# Patient Record
Sex: Male | Born: 1970 | Marital: Married | Smoking: Former smoker
Health system: Southern US, Community
[De-identification: ages and names within clinical notes are randomized; demographics above are authoritative.]

## PROBLEM LIST (undated history)

## (undated) DIAGNOSIS — E785 Hyperlipidemia, unspecified: Secondary | ICD-10-CM

## (undated) DIAGNOSIS — I2510 Atherosclerotic heart disease of native coronary artery without angina pectoris: Secondary | ICD-10-CM

## (undated) DIAGNOSIS — I10 Essential (primary) hypertension: Secondary | ICD-10-CM

## (undated) HISTORY — PX: CARDIAC CATHETERIZATION: SHX172

---

## 2015-09-14 HISTORY — PX: VASECTOMY: SHX75

## 2016-12-14 ENCOUNTER — Other Ambulatory Visit: Payer: Self-pay | Admitting: Gastroenterology

## 2016-12-14 DIAGNOSIS — R1084 Generalized abdominal pain: Secondary | ICD-10-CM

## 2016-12-25 ENCOUNTER — Other Ambulatory Visit: Payer: BC Managed Care – PPO

## 2016-12-28 ENCOUNTER — Ambulatory Visit
Admission: RE | Admit: 2016-12-28 | Discharge: 2016-12-28 | Disposition: A | Discharge status: Home / Self Care | Payer: BC Managed Care – PPO | Source: Ambulatory Visit | Attending: Gastroenterology | Admitting: Gastroenterology

## 2016-12-28 DIAGNOSIS — R1084 Generalized abdominal pain: Secondary | ICD-10-CM

## 2017-01-07 ENCOUNTER — Encounter: Payer: Self-pay | Admitting: Hematology and Oncology

## 2017-01-15 ENCOUNTER — Telehealth: Payer: Self-pay | Admitting: Hematology and Oncology

## 2017-01-15 ENCOUNTER — Ambulatory Visit (HOSPITAL_BASED_OUTPATIENT_CLINIC_OR_DEPARTMENT_OTHER): Payer: BC Managed Care – PPO | Admitting: Hematology and Oncology

## 2017-01-15 ENCOUNTER — Encounter: Payer: Self-pay | Admitting: Hematology and Oncology

## 2017-01-15 ENCOUNTER — Ambulatory Visit (HOSPITAL_BASED_OUTPATIENT_CLINIC_OR_DEPARTMENT_OTHER): Payer: BC Managed Care – PPO

## 2017-01-15 ENCOUNTER — Telehealth: Payer: Self-pay

## 2017-01-15 VITALS — BP 129/74 | HR 68 | Temp 98.5°F | Resp 20 | Ht 73.5 in | Wt 219.4 lb

## 2017-01-15 DIAGNOSIS — R5383 Other fatigue: Secondary | ICD-10-CM

## 2017-01-15 DIAGNOSIS — R740 Nonspecific elevation of levels of transaminase and lactic acid dehydrogenase [LDH]: Secondary | ICD-10-CM

## 2017-01-15 DIAGNOSIS — D709 Neutropenia, unspecified: Secondary | ICD-10-CM

## 2017-01-15 DIAGNOSIS — D696 Thrombocytopenia, unspecified: Secondary | ICD-10-CM | POA: Diagnosis not present

## 2017-01-15 DIAGNOSIS — D7589 Other specified diseases of blood and blood-forming organs: Secondary | ICD-10-CM

## 2017-01-15 DIAGNOSIS — R7401 Elevation of levels of liver transaminase levels: Secondary | ICD-10-CM | POA: Insufficient documentation

## 2017-01-15 LAB — COMPREHENSIVE METABOLIC PANEL WITH GFR
ALT: 116 U/L — ABNORMAL HIGH (ref 0–55)
AST: 57 U/L — ABNORMAL HIGH (ref 5–34)
Albumin: 4.9 g/dL (ref 3.5–5.0)
Alkaline Phosphatase: 68 U/L (ref 40–150)
Anion Gap: 10 meq/L (ref 3–11)
BUN: 10.1 mg/dL (ref 7.0–26.0)
CO2: 30 meq/L — ABNORMAL HIGH (ref 22–29)
Calcium: 9.7 mg/dL (ref 8.4–10.4)
Chloride: 102 meq/L (ref 98–109)
Creatinine: 1 mg/dL (ref 0.7–1.3)
EGFR: 87 ml/min/1.73 m2 — ABNORMAL LOW
Glucose: 87 mg/dL (ref 70–140)
Potassium: 3.8 meq/L (ref 3.5–5.1)
Sodium: 142 meq/L (ref 136–145)
Total Bilirubin: 0.97 mg/dL (ref 0.20–1.20)
Total Protein: 7.9 g/dL (ref 6.4–8.3)

## 2017-01-15 LAB — MORPHOLOGY: PLT EST: DECREASED

## 2017-01-15 LAB — LACTATE DEHYDROGENASE: LDH: 186 U/L (ref 125–245)

## 2017-01-15 NOTE — Progress Notes (Signed)
[facility] New Visit:  Assessment: Thrombocytopenia ([place]) Overall healthy 46 year old male referred to the clinic or abnormalities were discovered on lab work obtained 14 yearly visit. This consisted of a mild thrombocytopenia with no previous lab work available for comparison, leukopenia with predominant lymphopenia and normal hemoglobin with macrocytosis and hyperchromia in the setting of elevated inflammatory markers judged by the level of ferritin. Concurrently, hepatic abnormalities were discovered with mild elevation of bilirubin, AST and ALT. She was referred to [place] for evaluation of hematological abnormalities.  Repeat labwork was obtained last week and demonstrated improving white blood cell count, stable hemoglobin and stable platelets. He remains elevated and possibly trending up.  Judging by the clinical history, the differential diagnosis likely includes a viral infection with EBV or CMV as patient is negative for hepatitis viruses. Additionally, hemachromatosis should be considered in the setting of the elevation of the ferritin. Both infectious mononucleosis and iron overload would count for the hematological abnormalities and radiographically abnormal appearance of the liver.  Today, we will focus on the viral serologies and we'll bring the patient back in 1 week to discuss the findings with additional labs obtained at that time to pursue the possible hemachromatosis diagnosis.  Plan: --Labs today --RTC 1 week for review    Voice recognition software was used and creation of this note. Despite my best effort at editing the text, some misspelling/errors may have occurred.  Orders Placed This Encounter  Procedures  . Comprehensive metabolic panel    Standing Status:   Future    Number of Occurrences:   1    Standing Expiration Date:   01/15/2018  . Lactate dehydrogenase (LDH)    Standing Status:   Future    Number of Occurrences:   1    Standing Expiration  Date:   01/15/2018  . Morphology    Standing Status:   Future    Number of Occurrences:   1    Standing Expiration Date:   01/15/2018  . TSH    Standing Status:   Future    Number of Occurrences:   1    Standing Expiration Date:   01/15/2018  . Folate, Serum    Standing Status:   Future    Number of Occurrences:   1    Standing Expiration Date:   01/15/2018  . Vitamin B12    Standing Status:   Future    Number of Occurrences:   1    Standing Expiration Date:   01/15/2018  . ANA, IFA (with reflex)    Standing Status:   Future    Number of Occurrences:   1    Standing Expiration Date:   01/15/2018  . Epstein-Barr virus VCA, IgG    Standing Status:   Future    Number of Occurrences:   1    Standing Expiration Date:   01/15/2018  . Epstein-Barr virus VCA, IgM    Standing Status:   Future    Number of Occurrences:   1    Standing Expiration Date:   01/15/2018  . Epstein-Barr virus early D antigen antibody, IgG    Standing Status:   Future    Number of Occurrences:   1    Standing Expiration Date:   01/15/2018  . Epstein-Barr virus nuclear antigen antibody, IgG    Standing Status:   Future    Number of Occurrences:   1    Standing Expiration Date:   01/15/2018  .  CMV IgM    Standing Status:   Future    Number of Occurrences:   1    Standing Expiration Date:   01/15/2018  . CMV antibody, IgG (EIA)    Standing Status:   Future    Number of Occurrences:   1    Standing Expiration Date:   01/15/2018  . Rheumatoid factor    Standing Status:   Future    Number of Occurrences:   1    Standing Expiration Date:   01/15/2018    All questions were answered.  . The patient knows to call the clinic with any problems, questions or concerns.  This note was electronically signed.    History of Presenting Illness MAKYI LEDO 46 y.o. presenting to the [place] for evaluation of leukopenia and thrombocytopenia. 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 It appears that he had a 6  contact with a candidate who had infectious mononucleosis towards the end of the school year. At the end of the school year and slightly afterwards, he has been experiencing significant fatigue, but no lymphadenopathy, nausea, abdominal pain, or early satiety. Symptoms have improved gradually and by now he has returned to his baseline state of health. He is baseline state of health includes an alternating and off constipation and diarrhea. He experiences no bowel movements for approximately 3 days than has a bout of feeling unwell with headaches and dizziness and subsequently passes hard bowel movement full by liquid diarrhea. He denies having red blood or melena. No abdominal pain at the baseline. No nausea or vomiting. He denies any respiratory, cardiovascular, neurological, genitourinary, or musculoskeletal symptoms at this time.  Oncological/hematological History: --Labs, 12/03/16: WBC 3.0, Hgb 15.8, MCV 99.0, MCH 34.5, Plt 104; tProt 7.3, Alb 5.0, tBili 1.1, AP 55, AST 49, ALT 97; TSH 2.11 --Labs, 12/14/16: Ferritin 438; HCV &  HBV -- negative; --Labs, 01/08/17: WBC 4.0, ANC 2.7, ALC 0.80, Mono 0.5, Baso 0.0, Hgb 15.4, MCV 96.9, MCH 34.5, Plt 107; tBili 1.1, AP 62, AST 60, ALT 120; Ferritin 705, ANA -- positive  Medical History: History reviewed. No pertinent past medical history.  Surgical History: Past Surgical History:  Procedure Laterality Date  . VASECTOMY  09/2015    Family History: Family History  Problem Relation Age of Onset  . Colitis Mother   . Cancer Father        Jaw and lung  . Stroke Maternal Grandfather        Cause of death - young  . Heart attack Paternal Grandfather     Social History: Social History   Social History  . Marital status: Unknown    Spouse name: N/A  . Number of children: N/A  . Years of education: N/A   Occupational History  . Not on file.   Social History Main Topics  . Smoking status: Current Some Day Smoker  . Smokeless tobacco:  Former Systems developer     Comment: social  . Alcohol use 0.6 oz/week    1 Cans of beer per week  . Drug use: Yes    Types: Marijuana  . Sexual activity: Not on file   Other Topics Concern  . Not on file   Social History Narrative  . No narrative on file    Allergies: Allergies  Allergen Reactions  . Amoxicillin Swelling and Rash  . Biaxin [Clarithromycin] Swelling    Medications:  Current Outpatient Prescriptions  Medication Sig Dispense Refill  .  fexofenadine (ALLEGRA) 30 MG tablet Take 30 mg by mouth daily.     No current facility-administered medications for this visit.     Review of Systems: Review of Systems  All other systems reviewed and are negative.    PHYSICAL EXAMINATION Blood pressure 129/74, pulse 68, temperature 98.5 F (36.9 C), temperature source Oral, resp. rate 20, height 6' 1.5" (1.867 m), weight 219 lb 6.4 oz (99.5 kg), SpO2 100 %.  ECOG PERFORMANCE STATUS: 0 - Asymptomatic  Physical Exam  Constitutional: He is oriented to person, place, and time and well-developed, well-nourished, and in no distress. No distress.  HENT:  Head: Normocephalic and atraumatic.  Mouth/Throat: Oropharynx is clear and moist. No oropharyngeal exudate.  Eyes: Pupils are equal, round, and reactive to light. No scleral icterus.  Neck: Normal range of motion. No thyromegaly present.  Cardiovascular: Normal rate, regular rhythm and normal heart sounds.   No murmur heard. Pulmonary/Chest: Effort normal and breath sounds normal. No respiratory distress. He has no wheezes.  Abdominal: Soft. He exhibits no distension and no mass. There is no tenderness.  Musculoskeletal: Normal range of motion.  Lymphadenopathy:    He has no cervical adenopathy.  Neurological: He is alert and oriented to person, place, and time. He has normal reflexes. No cranial nerve deficit.  Skin: Skin is warm. No rash noted. He is not diaphoretic. No erythema. No pallor.     LABORATORY DATA: I have  personally reviewed the data as listed: Appointment on 01/15/2017  Component Date Value Ref Range Status  . Sodium 01/15/2017 142  136 - 145 mEq/L Final  . Potassium 01/15/2017 3.8  3.5 - 5.1 mEq/L Final  . Chloride 01/15/2017 102  98 - 109 mEq/L Final  . CO2 01/15/2017 30* 22 - 29 mEq/L Final  . Glucose 01/15/2017 87  70 - 140 mg/dl Final   Glucose reference range is for nonfasting patients. Fasting glucose reference range is 70- 100.  Marland Kitchen BUN 01/15/2017 10.1  7.0 - 26.0 mg/dL Final  . Creatinine 01/15/2017 1.0  0.7 - 1.3 mg/dL Final  . Total Bilirubin 01/15/2017 0.97  0.20 - 1.20 mg/dL Final  . Alkaline Phosphatase 01/15/2017 68  40 - 150 U/L Final  . AST 01/15/2017 57* 5 - 34 U/L Final  . ALT 01/15/2017 116* 0 - 55 U/L Final  . Total Protein 01/15/2017 7.9  6.4 - 8.3 g/dL Final  . Albumin 01/15/2017 4.9  3.5 - 5.0 g/dL Final  . Calcium 01/15/2017 9.7  8.4 - 10.4 mg/dL Final  . Anion Gap 01/15/2017 10  3 - 11 mEq/L Final  . EGFR 01/15/2017 87* >90 ml/min/1.73 m2 Final   eGFR is calculated using the CKD-EPI Creatinine Equation (2009)  . LDH 01/15/2017 186  125 - 245 U/L Final  . Polychromasia 01/15/2017 Slight  Slight Final  . Tear Drop Cells 01/15/2017 Oc  Negative Final  . Ovalocytes 01/15/2017 Few  Negative Final  . White Cell Comments 01/15/2017 C/W auto diff   Final  . PLT EST 01/15/2017 Decreased  Adequate Final         Ardath Sax, MD

## 2017-01-15 NOTE — Assessment & Plan Note (Signed)
Overall healthy 46 year old male referred to the clinic or abnormalities were discovered on lab work obtained 14 yearly visit. This consisted of a mild thrombocytopenia with no previous lab work available for comparison, leukopenia with predominant lymphopenia and normal hemoglobin with macrocytosis and hyperchromia in the setting of elevated inflammatory markers judged by the level of ferritin. Concurrently, hepatic abnormalities were discovered with mild elevation of bilirubin, AST and ALT. She was referred to us for evaluation of hematological abnormalities.  Repeat labwork was obtained last week and demonstrated improving white blood cell count, stable hemoglobin and stable platelets. He remains elevated and possibly trending up.  Judging by the clinical history, the differential diagnosis likely includes a viral infection with EBV or CMV as patient is negative for hepatitis viruses. Additionally, hemachromatosis should be considered in the setting of the elevation of the ferritin. Both infectious mononucleosis and iron overload would count for the hematological abnormalities and radiographically abnormal appearance of the liver.  Today, we will focus on the viral serologies and we'll bring the patient back in 1 week to discuss the findings with additional labs obtained at that time to pursue the possible hemachromatosis diagnosis.  Plan: --Labs today --RTC 1 week for review

## 2017-01-15 NOTE — Telephone Encounter (Signed)
Requested lab work to be sent to [phone]. Secretary said she would send labs from June and July: CBC, Iron, and Testosterone.  Received: TSH, CMET (12/03/16); Ferritin, Iron, Hepatitis Panel (12/14/16); CBC, CMET, ANA, Ferritin (01/08/17)

## 2017-01-15 NOTE — Telephone Encounter (Signed)
Scheduled appt per 8/3 los - Gave patient AVS and calender per los. - labs today .

## 2017-01-16 LAB — EPSTEIN-BARR VIRUS EARLY D ANTIGEN ANTIBODY, IGG

## 2017-01-16 LAB — RHEUMATOID FACTOR: RA Latex Turbid.: 170.9 IU/mL — ABNORMAL HIGH (ref 0.0–13.9)

## 2017-01-16 LAB — CMV ANTIBODY, IGG (EIA)

## 2017-01-16 LAB — EPSTEIN-BARR VIRUS NUCLEAR ANTIGEN ANTIBODY, IGG: EBV NA IgG: 163 U/mL — ABNORMAL HIGH (ref 0.0–17.9)

## 2017-01-16 LAB — EPSTEIN-BARR VIRUS VCA, IGG: EBV VCA IgG: 291 U/mL — ABNORMAL HIGH (ref 0.0–17.9)

## 2017-01-16 LAB — VITAMIN B12: Vitamin B12: 565 pg/mL (ref 232–1245)

## 2017-01-16 LAB — EPSTEIN-BARR VIRUS VCA, IGM

## 2017-01-16 LAB — CMV IGM

## 2017-01-16 LAB — FOLATE: Folate: >20 ng/mL (ref >3.0)

## 2017-01-18 LAB — TSH: TSH: 2.087 m(IU)/L (ref 0.320–4.118)

## 2017-01-18 LAB — ANTINUCLEAR ANTIBODIES, IFA
ANTINUCLEAR ANTIBODIES, IFA: POSITIVE — AB
Speckled Pattern: >1:1280 {titer} — AB

## 2017-01-22 ENCOUNTER — Ambulatory Visit (HOSPITAL_BASED_OUTPATIENT_CLINIC_OR_DEPARTMENT_OTHER): Payer: BC Managed Care – PPO

## 2017-01-22 ENCOUNTER — Telehealth: Payer: Self-pay | Admitting: Hematology

## 2017-01-22 ENCOUNTER — Encounter: Payer: Self-pay | Admitting: Hematology and Oncology

## 2017-01-22 ENCOUNTER — Ambulatory Visit (HOSPITAL_BASED_OUTPATIENT_CLINIC_OR_DEPARTMENT_OTHER): Payer: BC Managed Care – PPO | Admitting: Hematology and Oncology

## 2017-01-22 VITALS — BP 102/58 | HR 63 | Temp 98.3°F | Resp 18 | Ht 73.3 in | Wt 219.4 lb

## 2017-01-22 DIAGNOSIS — D696 Thrombocytopenia, unspecified: Secondary | ICD-10-CM

## 2017-01-22 DIAGNOSIS — D7589 Other specified diseases of blood and blood-forming organs: Secondary | ICD-10-CM | POA: Diagnosis not present

## 2017-01-22 DIAGNOSIS — R5383 Other fatigue: Secondary | ICD-10-CM

## 2017-01-22 DIAGNOSIS — R768 Other specified abnormal immunological findings in serum: Secondary | ICD-10-CM | POA: Diagnosis not present

## 2017-01-22 DIAGNOSIS — D709 Neutropenia, unspecified: Secondary | ICD-10-CM | POA: Diagnosis not present

## 2017-01-22 DIAGNOSIS — R740 Nonspecific elevation of levels of transaminase and lactic acid dehydrogenase [LDH]: Secondary | ICD-10-CM

## 2017-01-22 DIAGNOSIS — R7401 Elevation of levels of liver transaminase levels: Secondary | ICD-10-CM

## 2017-01-22 LAB — PROTIME-INR
INR: 1 — ABNORMAL LOW (ref 2.00–3.50)
Protime: 12 Seconds (ref 10.6–13.4)

## 2017-01-22 NOTE — Telephone Encounter (Signed)
Scheduled appt per 8/10 los - Gave patient AVS and calender per los. - referral order not placed yet.

## 2017-01-22 NOTE — Patient Instructions (Signed)
Thank you for choosing [facility] to provide your oncology and hematology care.  To afford each patient quality time with our providers, please arrive 30 minutes before your scheduled appointment time.  If you arrive late for your appointment, you may be asked to reschedule.  We strive to give you quality time with our providers, and arriving late affects you and other patients whose appointments are after yours.   If you are a no show for multiple scheduled visits, you may be dismissed from the clinic at the providers discretion.    Again, thank you for choosing [facility], our hope is that these requests will decrease the amount of time that you wait before being seen by our physicians.  ______________________________________________________________________  Should you have questions after your visit to the [facility], please contact our office at [phone] between the hours of 8:30 and 4:30 p.m.    Voicemails left after 4:30p.m will not be returned until the following business day.    For prescription refill requests, please have your pharmacy contact us directly.  Please also try to allow 48 hours for prescription requests.    Please contact the scheduling department for questions regarding scheduling.  For scheduling of procedures such as PET scans, CT scans, MRI, Ultrasound, etc please contact central scheduling at [phone].    Resources For Cancer Patients and Caregivers:   Oncolink.org:  A wonderful resource for patients and healthcare providers for information regarding your disease, ways to tract your treatment, what to expect, etc.     American Cancer Society:  800-227-2345  Can help patients locate various types of support and financial assistance  Cancer Care: 1-800-813-HOPE (4673) Provides financial assistance, online support groups, medication/co-pay assistance.    [place] DSS:  336-641-3447 Where to apply for food  stamps, Medicaid, and utility assistance  Medicare Rights Center: 800-333-4114 Helps people with Medicare understand their rights and benefits, navigate the Medicare system, and secure the quality healthcare they deserve  SCAT: 336-333-6589 [place] [employer]'s shared-ride transportation service for eligible riders who have a disability that prevents them from riding the fixed route bus.    For additional information on assistance programs please contact our social worker:   Grier Hock/Abigail Elmore:  [phone]            

## 2017-01-23 LAB — APTT: APTT: 31 s (ref 24–33)

## 2017-01-25 DIAGNOSIS — R768 Other specified abnormal immunological findings in serum: Secondary | ICD-10-CM | POA: Insufficient documentation

## 2017-01-25 LAB — BETA-2-GLYCOPROTEIN I ABS, IGG/M/A
Beta-2 Glyco 1 IgA: <9 GPI IgA units (ref 0–25)
Beta-2 Glycoprotein I Ab, IgG: <9 GPI IgG units (ref 0–20)

## 2017-01-25 LAB — LUPUS ANTICOAGULANT PANEL
DRVVT MIX: 44.3 s (ref 0.0–47.0)
DRVVT: 47.6 s — AB (ref 0.0–47.0)
PTT-LA: 47 s (ref 0.0–51.9)

## 2017-01-25 NOTE — Assessment & Plan Note (Signed)
Overall healthy 46 year old male referred to the clinic or abnormalities were discovered on lab work obtained on his yearly visit. This consisted of a mild thrombocytopenia with no previous lab work available for comparison, leukopenia with predominant lymphopenia and normal hemoglobin with macrocytosis and hyperchromia in the setting of elevated inflammatory markers as judged by the level of ferritin. Concurrently, hepatic abnormalities were discovered with mild elevation of bilirubin, AST and ALT. She was referred to us for evaluation of hematological abnormalities.  Repeat labwork was obtained last week and demonstrated improving white blood cell count, stable hemoglobin and stable platelets. Additional evaluation demonstrated significantly elevated antineutrophil antibodies titer of 1:1280 in speckled pattern. This may be consistent with systemic lupus. Due to presence of hepatic abnormalities indicating possible hepatitis, as well as cytopenias, additional evaluation by rheumatology seems to be the appropriate next step. On Hematology side, I will obtain testing for possible antiphospholipid antibody syndrome which may place patient increased risk of developing deep vein thrombosis.   Plan: --APLS testing --Referral to Rheumatology --Return to my clinic in 3 months for continued hematological monitoring  Voice recognition software was used and creation of this note. Despite my best effort at editing the text, some misspelling/errors may have occurred.

## 2017-01-25 NOTE — Progress Notes (Signed)
[facility] Follow-up Visit:  Assessment: Elevated antinuclear antibody (ANA) level Overall healthy 46 year old male referred to the clinic or abnormalities were discovered on lab work obtained on his yearly visit. This consisted of a mild thrombocytopenia with no previous lab work available for comparison, leukopenia with predominant lymphopenia and normal hemoglobin with macrocytosis and hyperchromia in the setting of elevated inflammatory markers as judged by the level of ferritin. Concurrently, hepatic abnormalities were discovered with mild elevation of bilirubin, AST and ALT. She was referred to us for evaluation of hematological abnormalities.  Repeat labwork was obtained last week and demonstrated improving white blood cell count, stable hemoglobin and stable platelets. Additional evaluation demonstrated significantly elevated antineutrophil antibodies titer of 1:1280 in speckled pattern. This may be consistent with systemic lupus. Due to presence of hepatic abnormalities indicating possible hepatitis, as well as cytopenias, additional evaluation by rheumatology seems to be the appropriate next step. On Hematology side, I will obtain testing for possible antiphospholipid antibody syndrome which may place patient increased risk of developing deep vein thrombosis.   Plan: --APLS testing --Referral to Rheumatology --Return to my clinic in 3 months for continued hematological monitoring  Voice recognition software was used and creation of this note. Despite my best effort at editing the text, some misspelling/errors may have occurred.    Orders Placed This Encounter  Procedures  . Beta-2-glycoprotein i abs, IgG/M/A    Standing Status:   Future    Number of Occurrences:   1    Standing Expiration Date:   01/22/2018  . Cardiolipin antibodies, IgG, IgM, IgA*    Standing Status:   Future    Number of Occurrences:   1    Standing Expiration Date:   01/22/2018  . APTT     Standing Status:   Future    Number of Occurrences:   1    Standing Expiration Date:   01/22/2018  . Protime-INR    Standing Status:   Future    Number of Occurrences:   1    Standing Expiration Date:   01/22/2018  . Lupus anticoagulant panel*    Standing Status:   Future    Number of Occurrences:   1    Standing Expiration Date:   01/22/2018  . CBC & Diff and Retic    Standing Status:   Future    Standing Expiration Date:   01/22/2018  . Comprehensive metabolic panel    Standing Status:   Future    Standing Expiration Date:   01/22/2018  . Lactate dehydrogenase (LDH)    Standing Status:   Future    Standing Expiration Date:   01/22/2018  . Ambulatory referral to Rheumatology    Referral Priority:   Routine    Referral Type:   Consultation    Referral Reason:   Specialty Services Required    Requested Specialty:   Rheumatology    Number of Visits Requested:   1    All questions were answered.  . The patient knows to call the clinic with any problems, questions or concerns.  This note was electronically signed.    History of Presenting Illness Craig Macias is a  46 y.o. [facility] of leukopenia and thrombocytopenia. 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 It appears that he had a 6 contact with a candidate who had infectious mononucleosis towards the end of the school year. At the end of the school  year and slightly afterwards, he has been experiencing significant fatigue, but no lymphadenopathy, nausea, abdominal pain, or early satiety. Symptoms have improved gradually and by now he has returned to his baseline state of health. He is baseline state of health includes an alternating and off constipation and diarrhea. He experiences no bowel movements for approximately 3 days than has a bout of feeling unwell with headaches and dizziness and subsequently passes hard bowel movement full by liquid diarrhea. He denies having red blood or  melena. No abdominal pain at the baseline. No nausea or vomiting. He denies any respiratory, cardiovascular, neurological, genitourinary, or musculoskeletal symptoms at this time.  Patient returns to the clinic to discuss results of the preceding evaluation. He denies any new symptoms since the last visit to the clinic. As, patient does report occasional mouth sores which usually accompany viral infections. Since sensitivity with hives following sun burns.  Oncological/hematological History: --Labs, 12/03/16: WBC 3.0, Hgb 15.8, MCV 99.0, MCH 34.5, Plt 104; tProt 7.3, Alb 5.0, tBili 1.1, AP 55, AST 49, ALT 97; TSH 2.11 --Labs, 12/14/16: Ferritin 438; HCV &  HBV -- negative; --Labs, 01/08/17: WBC 4.0, ANC 2.7, ALC 0.80, Mono 0.5, Baso 0.0, Hgb 15.4, MCV 96.9, MCH 34.5, Plt 107; tBili 1.1, AP 62, AST 60, ALT 120; Ferritin 705, ANA -- positive --Labs, 01/15/17: ANA -- positive, speckled, 1:1280 titer; no evidence of active infectious mononucleosis infection.   Medical History: History reviewed. No pertinent past medical history.  Surgical History: Past Surgical History:  Procedure Laterality Date  . VASECTOMY  09/2015    Family History: Family History  Problem Relation Age of Onset  . Colitis Mother   . Cancer Father        Jaw and lung  . Stroke Maternal Grandfather        Cause of death - young  . Heart attack Paternal Grandfather     Social History: Social History   Social History  . Marital status: Unknown    Spouse name: N/A  . Number of children: N/A  . Years of education: N/A   Occupational History  . Not on file.   Social History Main Topics  . Smoking status: Current Some Day Smoker  . Smokeless tobacco: Former Neurosurgeon     Comment: social  . Alcohol use 0.6 oz/week    1 Cans of beer per week  . Drug use: Yes    Types: Marijuana  . Sexual activity: Not on file   Other Topics Concern  . Not on file   Social History Narrative  . No narrative on file     Allergies: Allergies  Allergen Reactions  . Amoxicillin Swelling and Rash  . Biaxin [Clarithromycin] Swelling    Medications:  Current Outpatient Prescriptions  Medication Sig Dispense Refill  . fexofenadine (ALLEGRA) 30 MG tablet Take 30 mg by mouth daily.     No current facility-administered medications for this visit.     Review of Systems: Review of Systems  All other systems reviewed and are negative.    PHYSICAL EXAMINATION Blood pressure (!) 102/58, pulse 63, temperature 98.3 F (36.8 C), temperature source Oral, resp. rate 18, height 6' 1.3" (1.862 m), weight 219 lb 6.4 oz (99.5 kg), SpO2 99 %.  ECOG PERFORMANCE STATUS: 0 - Asymptomatic  Physical Exam  Constitutional: He is oriented to person, place, and time and well-developed, well-nourished, and in no distress. No distress.  HENT:  Head: Normocephalic and atraumatic.  Mouth/Throat: Oropharynx is clear and moist.  No oropharyngeal exudate.  Eyes: Pupils are equal, round, and reactive to light. No scleral icterus.  Neck: Normal range of motion. No thyromegaly present.  Cardiovascular: Normal rate, regular rhythm and normal heart sounds.   No murmur heard. Pulmonary/Chest: Effort normal and breath sounds normal. No respiratory distress. He has no wheezes.  Abdominal: Soft. He exhibits no distension and no mass. There is no tenderness.  Musculoskeletal: Normal range of motion.  Lymphadenopathy:    He has no cervical adenopathy.  Neurological: He is alert and oriented to person, place, and time. He has normal reflexes. No cranial nerve deficit.  Skin: Skin is warm. No rash noted. He is not diaphoretic. No erythema. No pallor.     LABORATORY DATA: I have personally reviewed the data as listed: Appointment on 01/22/2017  Component Date Value Ref Range Status  . aPTT 01/22/2017 31  24 - 33 sec Final   Comment: This test has not been validated for monitoring unfractionated heparin therapy. aPTT-based  therapeutic ranges for unfractionated heparin therapy have not been established. For general guidelines on Heparin monitoring, refer to the [employer].   . Protime 01/22/2017 12.0  10.6 - 13.4 Seconds Final  . INR 01/22/2017 1.00* 2.00 - 3.50 Final   Comment: INR is useful only to assess adequacy of anticoagulation with coumadin when comparing results from different labs. It should not be used to estimate bleeding risk or presence/abscense of coagulopathy in patients not on coumadin. Expected INR ranges for  nontherapeutic patients is 0.88 - 1.12.   Marland Kitchen Lovenox 01/22/2017 No   Final       Daisy Blossom, MD

## 2017-01-26 LAB — CARDIOLIPIN ANTIBODIES, IGG, IGM, IGA

## 2017-04-22 ENCOUNTER — Telehealth: Payer: Self-pay | Admitting: Hematology and Oncology

## 2017-04-22 NOTE — Telephone Encounter (Signed)
Patient called in to reschedule he said he did not know he had to come in.

## 2017-04-23 ENCOUNTER — Other Ambulatory Visit: Payer: BC Managed Care – PPO

## 2017-04-23 ENCOUNTER — Ambulatory Visit: Payer: BC Managed Care – PPO | Admitting: Hematology and Oncology

## 2017-05-05 ENCOUNTER — Other Ambulatory Visit (HOSPITAL_BASED_OUTPATIENT_CLINIC_OR_DEPARTMENT_OTHER): Payer: BC Managed Care – PPO

## 2017-05-05 ENCOUNTER — Encounter: Payer: Self-pay | Admitting: Hematology and Oncology

## 2017-05-05 ENCOUNTER — Telehealth: Payer: Self-pay

## 2017-05-05 ENCOUNTER — Ambulatory Visit: Payer: BC Managed Care – PPO | Admitting: Hematology and Oncology

## 2017-05-05 VITALS — BP 188/79 | HR 79 | Temp 98.8°F | Resp 20 | Ht 73.0 in | Wt 218.0 lb

## 2017-05-05 DIAGNOSIS — D709 Neutropenia, unspecified: Secondary | ICD-10-CM

## 2017-05-05 DIAGNOSIS — R768 Other specified abnormal immunological findings in serum: Secondary | ICD-10-CM | POA: Diagnosis not present

## 2017-05-05 DIAGNOSIS — D7589 Other specified diseases of blood and blood-forming organs: Secondary | ICD-10-CM

## 2017-05-05 DIAGNOSIS — D696 Thrombocytopenia, unspecified: Secondary | ICD-10-CM | POA: Diagnosis not present

## 2017-05-05 DIAGNOSIS — R5383 Other fatigue: Secondary | ICD-10-CM | POA: Diagnosis not present

## 2017-05-05 DIAGNOSIS — R7401 Elevation of levels of liver transaminase levels: Secondary | ICD-10-CM

## 2017-05-05 DIAGNOSIS — R740 Nonspecific elevation of levels of transaminase and lactic acid dehydrogenase [LDH]: Secondary | ICD-10-CM

## 2017-05-05 LAB — COMPREHENSIVE METABOLIC PANEL
ALT: 241 U/L — AB (ref 0–55)
ANION GAP: 9 meq/L (ref 3–11)
AST: 107 U/L — AB (ref 5–34)
Albumin: 4.5 g/dL (ref 3.5–5.0)
Alkaline Phosphatase: 67 U/L (ref 40–150)
BUN: 13.6 mg/dL (ref 7.0–26.0)
CALCIUM: 9.2 mg/dL (ref 8.4–10.4)
CO2: 28 meq/L (ref 22–29)
CREATININE: 1 mg/dL (ref 0.7–1.3)
Chloride: 105 mEq/L (ref 98–109)
EGFR: >60 mL/min/{1.73_m2} (ref >60)
Glucose: 102 mg/dl (ref 70–140)
POTASSIUM: 4 meq/L (ref 3.5–5.1)
Sodium: 142 mEq/L (ref 136–145)
Total Bilirubin: 0.93 mg/dL (ref 0.20–1.20)
Total Protein: 7.6 g/dL (ref 6.4–8.3)

## 2017-05-05 LAB — CBC & DIFF AND RETIC
BASO%: 0.5 % (ref 0.0–2.0)
Basophils Absolute: 0 10*3/uL (ref 0.0–0.1)
EOS%: 0.8 % (ref 0.0–7.0)
Eosinophils Absolute: 0 10*3/uL (ref 0.0–0.5)
HCT: 44 % (ref 38.4–49.9)
HGB: 15.3 g/dL (ref 13.0–17.1)
Immature Retic Fract: 4.3 % (ref 3.00–10.60)
LYMPH#: 0.8 10*3/uL — AB (ref 0.9–3.3)
LYMPH%: 16 % (ref 14.0–49.0)
MCH: 33.9 pg — AB (ref 27.2–33.4)
MCHC: 34.9 g/dL (ref 32.0–36.0)
MCV: 97.1 fL (ref 79.3–98.0)
MONO#: 0.4 10*3/uL (ref 0.1–0.9)
MONO%: 7.6 % (ref 0.0–14.0)
NEUT#: 3.8 10*3/uL (ref 1.5–6.5)
NEUT%: 75.1 % — ABNORMAL HIGH (ref 39.0–75.0)
RBC: 4.53 10*6/uL (ref 4.20–5.82)
RDW: 12.7 % (ref 11.0–14.6)
RETIC CT ABS: 86.98 10*3/uL (ref 34.80–93.90)
Retic %: 1.92 % — ABNORMAL HIGH (ref 0.80–1.80)
WBC: 5 10*3/uL (ref 4.0–10.3)

## 2017-05-05 LAB — LACTATE DEHYDROGENASE: LDH: 200 U/L (ref 125–245)

## 2017-05-05 NOTE — Telephone Encounter (Signed)
Printed avs and calender for upcoming appointment. Per 11/21 los 

## 2017-05-22 NOTE — Assessment & Plan Note (Signed)
46 y.o. male referred to the clinic or abnormalities were discovered on lab work obtained on his yearly visit. This consisted of a mild thrombocytopenia with no previous lab work available for comparison, leukopenia with predominant lymphopenia and normal hemoglobin with macrocytosis and hyperchromia in the setting of elevated inflammatory markers as judged by the level of ferritin. Concurrently, hepatic abnormalities were discovered with mild elevation of bilirubin, AST and ALT. She was referred to us for evaluation of hematological abnormalities.  Repeat labwork demonstrated improving white blood cell count, stable hemoglobin and stable platelets. Additional evaluation demonstrated significantly elevated antineutrophil antibodies titer of 1:1280 in speckled pattern. Due to presence of hepatic abnormalities indicating possible hepatitis, as well as cytopenias, patient was referred to rheumatology.  No evidence of antiphospholipid antibody syndrome was discovered during our evaluation, rheumatology valuation did not reveal any definitive connective tissue disorder, but patient will be followed.  My end, we will continue monitoring patient's hematological profile.   Plan: --Return to clinic in 3 months with repeat lab work

## 2017-05-22 NOTE — Progress Notes (Signed)
[facility] Follow-up Visit:  Assessment: Thrombocytopenia ([name]) 46 y.o. male referred to the clinic or abnormalities were discovered on lab work obtained on his yearly visit. This consisted of a mild thrombocytopenia with no previous lab work available for comparison, leukopenia with predominant lymphopenia and normal hemoglobin with macrocytosis and hyperchromia in the setting of elevated inflammatory markers as judged by the level of ferritin. Concurrently, hepatic abnormalities were discovered with mild elevation of bilirubin, AST and ALT. She was referred to [place] for evaluation of hematological abnormalities.  Repeat labwork demonstrated improving white blood cell count, stable hemoglobin and stable platelets. Additional evaluation demonstrated significantly elevated antineutrophil antibodies titer of 1:1280 in speckled pattern. Due to presence of hepatic abnormalities indicating possible hepatitis, as well as cytopenias, patient was referred to rheumatology.  No evidence of antiphospholipid antibody syndrome was discovered during our evaluation, rheumatology valuation did not reveal any definitive connective tissue disorder, but patient will be followed.  My end, we will continue monitoring patient's hematological profile.   Plan: --Return to clinic in 3 months with repeat lab work   Voice recognition software was used and Agricultural engineer of this note. Despite my best effort at editing the text, some misspelling/errors may have occurred.  Orders Placed This Encounter  Procedures  . CBC & Diff and Retic    Standing Status:   Future    Standing Expiration Date:   05/05/2018  . Comprehensive metabolic panel    Standing Status:   Future    Standing Expiration Date:   05/05/2018    All questions were answered.  . The patient knows to call the clinic with any problems, questions or concerns.  This note was electronically signed.    History of Presenting Illness Craig Macias is a  46 y.o. followed in the [place] for evaluation of leukopenia and thrombocytopenia. 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 It appears that he had a 6 contact with a candidate who had infectious mononucleosis towards the end of the school year. At the end of the school year and slightly afterwards, he has been experiencing significant fatigue, but no lymphadenopathy, nausea, abdominal pain, or early satiety. Symptoms have improved gradually and by now he has returned to his baseline state of health. He is baseline state of health includes an alternating and off constipation and diarrhea. He experiences no bowel movements for approximately 3 days than has a bout of feeling unwell with headaches and dizziness and subsequently passes hard bowel movement full by liquid diarrhea. He denies having red blood or melena. No abdominal pain at the baseline. No nausea or vomiting. He denies any respiratory, cardiovascular, neurological, genitourinary, or musculoskeletal symptoms at this time.  Patient returns to the clinic to discuss results of the preceding evaluation.  In the interim, patient has been seen by rheumatology, Dr Page Spiro willing discovery of elevated ANA.  At this time, no definitive evidence of a connective tissue disorder was identified, but the patient is scheduled for follow-up for monitoring.  He denies any new symptoms since the last visit to the clinic. As, patient does report occasional mouth sores which usually accompany viral infections. Since sensitivity with hives following sun burns.  Oncological/hematological History: --Labs, 12/03/16: WBC 3.0, Hgb 15.8, MCV 99.0, MCH 34.5, Plt 104; tProt 7.3, Alb 5.0, tBili 1.1, AP 55, AST 49, ALT 97; TSH 2.11 --Labs, 12/14/16: Ferritin 438; HCV &  HBV -- negative; --Labs, 01/08/17: WBC 4.0, ANC 2.7, ALC  0.80, Mono 0.5, Baso 0.0, Hgb 15.4, MCV 96.9, MCH 34.5, Plt 107; tBili 1.1, AP 62, AST 60, ALT 120; Ferritin 705, ANA  -- positive --Labs, 01/15/17: ANA -- positive, speckled, 1:1280 titer; no evidence of active infectious mononucleosis infection.   Medical History: History reviewed. No pertinent past medical history.  Surgical History: Past Surgical History:  Procedure Laterality Date  . VASECTOMY  09/2015    Family History: Family History  Problem Relation Age of Onset  . Colitis Mother   . Cancer Father        Jaw and lung  . Stroke Maternal Grandfather        Cause of death - young  . Heart attack Paternal Grandfather     Social History: Social History   Socioeconomic History  . Marital status: Unknown    Spouse name: Not on file  . Number of children: Not on file  . Years of education: Not on file  . Highest education level: Not on file  Social Needs  . Financial resource strain: Not on file  . Food insecurity - worry: Not on file  . Food insecurity - inability: Not on file  . Transportation needs - medical: Not on file  . Transportation needs - non-medical: Not on file  Occupational History  . Not on file  Tobacco Use  . Smoking status: Current Some Day Smoker  . Smokeless tobacco: Former Systems developer  . Tobacco comment: social  Substance and Sexual Activity  . Alcohol use: Yes    Alcohol/week: 0.6 oz    Types: 1 Cans of beer per week  . Drug use: Yes    Types: Marijuana  . Sexual activity: Not on file  Other Topics Concern  . Not on file  Social History Narrative  . Not on file    Allergies: Allergies  Allergen Reactions  . Amoxicillin Swelling and Rash  . Biaxin [Clarithromycin] Swelling    Medications:  Current Outpatient Medications  Medication Sig Dispense Refill  . fexofenadine (ALLEGRA) 30 MG tablet Take 30 mg by mouth daily.     No current facility-administered medications for this visit.     Review of Systems: Review of Systems  All other systems reviewed and are negative.    PHYSICAL EXAMINATION Blood pressure (!) 188/79, pulse 79, temperature  98.8 F (37.1 C), temperature source Oral, resp. rate 20, height '6\' 1"'  (1.854 m), weight 218 lb (98.9 kg), SpO2 99 %.  ECOG PERFORMANCE STATUS: 0 - Asymptomatic  Physical Exam  Constitutional: He is oriented to person, place, and time and well-developed, well-nourished, and in no distress. No distress.  HENT:  Head: Normocephalic and atraumatic.  Mouth/Throat: Oropharynx is clear and moist. No oropharyngeal exudate.  Eyes: Pupils are equal, round, and reactive to light. No scleral icterus.  Neck: Normal range of motion. No thyromegaly present.  Cardiovascular: Normal rate, regular rhythm and normal heart sounds.  No murmur heard. Pulmonary/Chest: Effort normal and breath sounds normal. No respiratory distress. He has no wheezes.  Abdominal: Soft. He exhibits no distension and no mass. There is no tenderness.  Musculoskeletal: Normal range of motion.  Lymphadenopathy:    He has no cervical adenopathy.  Neurological: He is alert and oriented to person, place, and time. He has normal reflexes. No cranial nerve deficit.  Skin: Skin is warm. No rash noted. He is not diaphoretic. No erythema. No pallor.     LABORATORY DATA: I have personally reviewed the data as listed: Appointment on 05/05/2017  Component Date Value Ref Range Status  . WBC 05/05/2017 5.0  4.0 - 10.3 10e3/uL Final  . NEUT# 05/05/2017 3.8  1.5 - 6.5 10e3/uL Final  . HGB 05/05/2017 15.3  13.0 - 17.1 g/dL Final  . HCT 05/05/2017 44.0  38.4 - 49.9 % Final  . Platelets 05/05/2017 107 Large platelets present* 140 - 400 10e3/uL Final  . MCV 05/05/2017 97.1  79.3 - 98.0 fL Final  . MCH 05/05/2017 33.9* 27.2 - 33.4 pg Final  . MCHC 05/05/2017 34.9  32.0 - 36.0 g/dL Final  . RBC 05/05/2017 4.53  4.20 - 5.82 10e6/uL Final  . RDW 05/05/2017 12.7  11.0 - 14.6 % Final  . lymph# 05/05/2017 0.8* 0.9 - 3.3 10e3/uL Final  . MONO# 05/05/2017 0.4  0.1 - 0.9 10e3/uL Final  . Eosinophils Absolute 05/05/2017 0.0  0.0 - 0.5 10e3/uL Final   . Basophils Absolute 05/05/2017 0.0  0.0 - 0.1 10e3/uL Final  . NEUT% 05/05/2017 75.1* 39.0 - 75.0 % Final  . LYMPH% 05/05/2017 16.0  14.0 - 49.0 % Final  . MONO% 05/05/2017 7.6  0.0 - 14.0 % Final  . EOS% 05/05/2017 0.8  0.0 - 7.0 % Final  . BASO% 05/05/2017 0.5  0.0 - 2.0 % Final  . Retic % 05/05/2017 1.92* 0.80 - 1.80 % Final  . Retic Ct Abs 05/05/2017 86.98  34.80 - 93.90 10e3/uL Final  . Immature Retic Fract 05/05/2017 4.30  3.00 - 10.60 % Final  . Sodium 05/05/2017 142  136 - 145 mEq/L Final  . Potassium 05/05/2017 4.0  3.5 - 5.1 mEq/L Final  . Chloride 05/05/2017 105  98 - 109 mEq/L Final  . CO2 05/05/2017 28  22 - 29 mEq/L Final  . Glucose 05/05/2017 102  70 - 140 mg/dl Final   Glucose reference range is for nonfasting patients. Fasting glucose reference range is 70- 100.  Marland Kitchen BUN 05/05/2017 13.6  7.0 - 26.0 mg/dL Final  . Creatinine 05/05/2017 1.0  0.7 - 1.3 mg/dL Final  . Total Bilirubin 05/05/2017 0.93  0.20 - 1.20 mg/dL Final  . Alkaline Phosphatase 05/05/2017 67  40 - 150 U/L Final  . AST 05/05/2017 107* 5 - 34 U/L Final  . ALT 05/05/2017 241* 0 - 55 U/L Final  . Total Protein 05/05/2017 7.6  6.4 - 8.3 g/dL Final  . Albumin 05/05/2017 4.5  3.5 - 5.0 g/dL Final  . Calcium 05/05/2017 9.2  8.4 - 10.4 mg/dL Final  . Anion Gap 05/05/2017 9  3 - 11 mEq/L Final  . EGFR 05/05/2017 >60  >60 ml/min/1.73 m2 Final   eGFR is calculated using the CKD-EPI Creatinine Equation (2009)  . LDH 05/05/2017 200  125 - 245 U/L Final       Ardath Sax, MD

## 2017-07-06 ENCOUNTER — Ambulatory Visit (INDEPENDENT_AMBULATORY_CARE_PROVIDER_SITE_OTHER): Payer: BC Managed Care – PPO | Admitting: Psychology

## 2017-07-06 DIAGNOSIS — F4323 Adjustment disorder with mixed anxiety and depressed mood: Secondary | ICD-10-CM

## 2017-08-02 ENCOUNTER — Other Ambulatory Visit: Payer: BC Managed Care – PPO

## 2017-08-04 ENCOUNTER — Ambulatory Visit: Payer: BC Managed Care – PPO | Admitting: Hematology and Oncology

## 2017-08-12 ENCOUNTER — Ambulatory Visit: Payer: BC Managed Care – PPO | Admitting: Hematology and Oncology

## 2017-09-10 ENCOUNTER — Ambulatory Visit (INDEPENDENT_AMBULATORY_CARE_PROVIDER_SITE_OTHER): Payer: BC Managed Care – PPO | Admitting: Psychology

## 2017-09-10 ENCOUNTER — Ambulatory Visit: Payer: BC Managed Care – PPO | Admitting: Psychology

## 2017-09-10 DIAGNOSIS — F4323 Adjustment disorder with mixed anxiety and depressed mood: Secondary | ICD-10-CM | POA: Diagnosis not present

## 2017-10-07 ENCOUNTER — Ambulatory Visit (INDEPENDENT_AMBULATORY_CARE_PROVIDER_SITE_OTHER): Payer: BC Managed Care – PPO | Admitting: Psychology

## 2017-10-07 DIAGNOSIS — F4323 Adjustment disorder with mixed anxiety and depressed mood: Secondary | ICD-10-CM

## 2017-10-11 ENCOUNTER — Telehealth: Payer: Self-pay | Admitting: Hematology and Oncology

## 2017-10-11 NOTE — Telephone Encounter (Signed)
Patient called to reschedule  °

## 2017-10-21 ENCOUNTER — Ambulatory Visit (INDEPENDENT_AMBULATORY_CARE_PROVIDER_SITE_OTHER): Payer: BC Managed Care – PPO | Admitting: Psychology

## 2017-10-21 DIAGNOSIS — F4323 Adjustment disorder with mixed anxiety and depressed mood: Secondary | ICD-10-CM | POA: Diagnosis not present

## 2017-11-04 ENCOUNTER — Ambulatory Visit (INDEPENDENT_AMBULATORY_CARE_PROVIDER_SITE_OTHER): Payer: BC Managed Care – PPO | Admitting: Psychology

## 2017-11-04 DIAGNOSIS — F4323 Adjustment disorder with mixed anxiety and depressed mood: Secondary | ICD-10-CM

## 2017-11-19 ENCOUNTER — Telehealth: Payer: Self-pay | Admitting: Hematology and Oncology

## 2017-11-19 NOTE — Telephone Encounter (Signed)
Called pt re appts bneing moved to [facility] - left vm for pt re appts and sending out confirmation letter

## 2017-11-24 ENCOUNTER — Ambulatory Visit (INDEPENDENT_AMBULATORY_CARE_PROVIDER_SITE_OTHER): Payer: BC Managed Care – PPO | Admitting: Psychology

## 2017-11-24 DIAGNOSIS — F4323 Adjustment disorder with mixed anxiety and depressed mood: Secondary | ICD-10-CM

## 2017-12-06 ENCOUNTER — Ambulatory Visit (INDEPENDENT_AMBULATORY_CARE_PROVIDER_SITE_OTHER): Payer: BC Managed Care – PPO | Admitting: Psychology

## 2017-12-06 DIAGNOSIS — F4323 Adjustment disorder with mixed anxiety and depressed mood: Secondary | ICD-10-CM | POA: Diagnosis not present

## 2017-12-13 ENCOUNTER — Inpatient Hospital Stay: Payer: BC Managed Care – PPO | Attending: Hematology

## 2017-12-13 DIAGNOSIS — D696 Thrombocytopenia, unspecified: Secondary | ICD-10-CM | POA: Diagnosis not present

## 2017-12-13 DIAGNOSIS — D72819 Decreased white blood cell count, unspecified: Secondary | ICD-10-CM | POA: Insufficient documentation

## 2017-12-13 DIAGNOSIS — D7589 Other specified diseases of blood and blood-forming organs: Secondary | ICD-10-CM

## 2017-12-13 DIAGNOSIS — K760 Fatty (change of) liver, not elsewhere classified: Secondary | ICD-10-CM | POA: Insufficient documentation

## 2017-12-13 DIAGNOSIS — D709 Neutropenia, unspecified: Secondary | ICD-10-CM

## 2017-12-13 LAB — CBC WITH DIFFERENTIAL (CANCER CENTER ONLY)
Basophils Absolute: 0 10*3/uL (ref 0.0–0.1)
Basophils Relative: 0 %
EOS PCT: 1 %
Eosinophils Absolute: 0 10*3/uL (ref 0.0–0.5)
HCT: 43.8 % (ref 38.4–49.9)
Hemoglobin: 15.9 g/dL (ref 13.0–17.1)
Lymphocytes Relative: 21 %
Lymphs Abs: 0.7 10*3/uL — ABNORMAL LOW (ref 0.9–3.3)
MCH: 34.8 pg — AB (ref 27.2–33.4)
MCHC: 36.3 g/dL — ABNORMAL HIGH (ref 32.0–36.0)
MCV: 95.8 fL (ref 79.3–98.0)
Monocytes Absolute: 0.6 10*3/uL (ref 0.1–0.9)
Monocytes Relative: 18 %
Neutro Abs: 2 10*3/uL (ref 1.5–6.5)
Neutrophils Relative %: 60 %
PLATELETS: 96 10*3/uL — AB (ref 140–400)
RBC: 4.57 MIL/uL (ref 4.20–5.82)
RDW: 12.9 % (ref 11.0–14.6)
WBC: 3.4 10*3/uL — AB (ref 4.0–10.3)

## 2017-12-13 LAB — COMPREHENSIVE METABOLIC PANEL
ALT: 133 U/L — ABNORMAL HIGH (ref 0–44)
AST: 68 U/L — AB (ref 15–41)
Albumin: 4.8 g/dL (ref 3.5–5.0)
Alkaline Phosphatase: 62 U/L (ref 38–126)
Anion gap: 5 (ref 5–15)
BILIRUBIN TOTAL: 1 mg/dL (ref 0.3–1.2)
BUN: 12 mg/dL (ref 6–20)
CO2: 32 mmol/L (ref 22–32)
CREATININE: 1.02 mg/dL (ref 0.61–1.24)
Calcium: 9.9 mg/dL (ref 8.9–10.3)
Chloride: 104 mmol/L (ref 98–111)
GFR calc Af Amer: >60 mL/min (ref >60)
Glucose, Bld: 82 mg/dL (ref 70–99)
Potassium: 4.2 mmol/L (ref 3.5–5.1)
Sodium: 141 mmol/L (ref 135–145)
TOTAL PROTEIN: 7.5 g/dL (ref 6.5–8.1)

## 2017-12-13 LAB — RETICULOCYTES
RBC.: 4.57 MIL/uL (ref 4.20–5.82)
RETIC COUNT ABSOLUTE: 100.5 10*3/uL — AB (ref 34.8–93.9)
Retic Ct Pct: 2.2 % — ABNORMAL HIGH (ref 0.8–1.8)

## 2017-12-13 NOTE — Progress Notes (Signed)
HEMATOLOGY/ONCOLOGY CONSULTATION NOTE  Date of Service: 12/14/2017  Patient Care Team: Patient, No Pcp Per as PCP - General (General Practice) [facility]  CHIEF COMPLAINTS/PURPOSE OF CONSULTATION:  Thrombocytopenia  HISTORY OF PRESENTING ILLNESS:   Craig Macias is a wonderful 47 y.o. male who has been previously followed by my colleague Dr Milinda AntisMikhail Perlov for evaluation and management of Thrombocytopenia. The pt reports that he is doing well overall.   The pt reports that his thrombocytopenia was initially found via a routine lab.    The pt has seen a GI regarding his fatty liver in the past. He notes that he does not drink ETOH very much, sometimes as much as 3-4 drinks a week with occasional binge drnking that he notes is becoming less and less frequent; 1-2 times a year.   He has also seen [facility] and reports that there were not significant findings but a plan to continue following the pt over time.   The pt notes that he has lost 8-10 pounds over the last month due to healthier diet and exercise.   Of note prior to the patient's visit today, pt has had US Abdomen completed on 12/28/16 with results revealing Evidence of hepatic steatosis by ultrasound.   Most recent lab results (12/13/17) of CBC w/diff, CMP, Reticulocytes is as follows: all values are WNL except for WBC at 3.4k, MCH at 34.8, MCHC at 36.3, PLT at 96k, Lymphs abs at 700, AST at 68, ALT at 133, Retic ct pct at 2.2%, Retic ct as at 100.5k. Rheumatoid factor 01/15/17 was elevated at 170.9 ANA, IFA w/reflex 01/15/17 revealed ANA Titer was Positive, Speckled pattern >1:1280.   On review of systems, pt reports good energy levels, intentional weight loss, and denies joint pains/swellings, skin rashes, abdominal pains, leg swelling, and any other symptoms.   On Social Hx the pt reports 3-4 ETOH drinks a week and rare binge drinking. Some marijuana smoking.  On Family Hx the pt denies any  auto-immune disorders, or lupus.    MEDICAL HISTORY:  History reviewed. No pertinent past medical history.  SURGICAL HISTORY: Past Surgical History:  Procedure Laterality Date  . VASECTOMY  09/2015    SOCIAL HISTORY: Social History   Socioeconomic History  . Marital status: Unknown    Spouse name: Not on file  . Number of children: Not on file  . Years of education: Not on file  . Highest education level: Not on file  Occupational History  . Not on file  Social Needs  . Financial resource strain: Not on file  . Food insecurity:    Worry: Not on file    Inability: Not on file  . Transportation needs:    Medical: Not on file    Non-medical: Not on file  Tobacco Use  . Smoking status: Current Some Day Smoker  . Smokeless tobacco: Former NeurosurgeonUser  . Tobacco comment: social  Substance and Sexual Activity  . Alcohol use: Yes    Alcohol/week: 0.6 oz    Types: 1 Cans of beer per week  . Drug use: Yes    Types: Marijuana  . Sexual activity: Not on file  Lifestyle  . Physical activity:    Days per week: Not on file    Minutes per session: Not on file  . Stress: Not on file  Relationships  . Social connections:    Talks on phone: Not on file    Gets together: Not on file  Attends religious service: Not on file    Active member of club or organization: Not on file    Attends meetings of clubs or organizations: Not on file    Relationship status: Not on file  . Intimate partner violence:    Fear of current or ex partner: Not on file    Emotionally abused: Not on file    Physically abused: Not on file    Forced sexual activity: Not on file  Other Topics Concern  . Not on file  Social History Narrative  . Not on file    FAMILY HISTORY: Family History  Problem Relation Age of Onset  . Colitis Mother   . Cancer Father        Jaw and lung  . Stroke Maternal Grandfather        Cause of death - young  . Heart attack Paternal Grandfather     ALLERGIES:  is  allergic to amoxicillin and biaxin [clarithromycin].  MEDICATIONS:  Current Outpatient Medications  Medication Sig Dispense Refill  . fexofenadine (ALLEGRA) 30 MG tablet Take 30 mg by mouth daily.     No current facility-administered medications for this visit.     REVIEW OF SYSTEMS:    10 Point review of Systems was done is negative except as noted above.  PHYSICAL EXAMINATION:  . Vitals:   12/14/17 0849  BP: 116/79  Pulse: 70  Resp: 18  Temp: 97.7 F (36.5 C)  SpO2: 100%   Filed Weights   12/14/17 0849  Weight: 205 lb 3.2 oz (93.1 kg)   .Body mass index is 27.07 kg/m.  GENERAL:alert, in no acute distress and comfortable SKIN: no acute rashes, no significant lesions EYES: conjunctiva are pink and non-injected, sclera anicteric OROPHARYNX: MMM, no exudates, no oropharyngeal erythema or ulceration NECK: supple, no JVD LYMPH:  no palpable lymphadenopathy in the cervical, axillary or inguinal regions LUNGS: clear to auscultation b/l with normal respiratory effort HEART: regular rate & rhythm ABDOMEN:  normoactive bowel sounds , non tender, not distended. No palpable hepatosplenomegaly. Extremity: no pedal edema PSYCH: alert & oriented x 3 with fluent speech NEURO: no focal motor/sensory deficits  LABORATORY DATA:  I have reviewed the data as listed  . CBC Latest Ref Rng & Units 12/13/2017 05/05/2017  WBC 4.0 - 10.3 K/uL 3.4(L) 5.0  Hemoglobin 13.0 - 17.1 g/dL 03.4 74.2  Hematocrit 59.5 - 49.9 % 43.8 44.0  Platelets 140 - 400 K/uL 96(L) 107 Large platelets present(L)   . CBC    Component Value Date/Time   WBC 3.4 (L) 12/13/2017 1103   WBC 5.0 05/05/2017 0914   RBC 4.57 12/13/2017 1103   RBC 4.57 12/13/2017 1103   HGB 15.9 12/13/2017 1103   HGB 15.3 05/05/2017 0914   HCT 43.8 12/13/2017 1103   HCT 44.0 05/05/2017 0914   PLT 96 (L) 12/13/2017 1103   PLT 107 Large platelets present (L) 05/05/2017 0914   MCV 95.8 12/13/2017 1103   MCV 97.1 05/05/2017 0914     MCH 34.8 (H) 12/13/2017 1103   MCHC 36.3 (H) 12/13/2017 1103   RDW 12.9 12/13/2017 1103   RDW 12.7 05/05/2017 0914   LYMPHSABS 0.7 (L) 12/13/2017 1103   LYMPHSABS 0.8 (L) 05/05/2017 0914   MONOABS 0.6 12/13/2017 1103   MONOABS 0.4 05/05/2017 0914   EOSABS 0.0 12/13/2017 1103   EOSABS 0.0 05/05/2017 0914   BASOSABS 0.0 12/13/2017 1103   BASOSABS 0.0 05/05/2017 0914    . CMP Latest Ref Rng &  Units 12/13/2017 05/05/2017 01/15/2017  Glucose 70 - 99 mg/dL 82 086 87  BUN 6 - 20 mg/dL 12 57.8 46.9  Creatinine 0.61 - 1.24 mg/dL 6.29 1.0 1.0  Sodium 528 - 145 mmol/L 141 142 142  Potassium 3.5 - 5.1 mmol/L 4.2 4.0 3.8  Chloride 98 - 111 mmol/L 104 - -  CO2 22 - 32 mmol/L 32 28 30(H)  Calcium 8.9 - 10.3 mg/dL 9.9 9.2 9.7  Total Protein 6.5 - 8.1 g/dL 7.5 7.6 7.9  Total Bilirubin 0.3 - 1.2 mg/dL 1.0 4.13 2.44  Alkaline Phos 38 - 126 U/L 62 67 68  AST 15 - 41 U/L 68(H) 107(H) 57(H)  ALT 0 - 44 U/L 133(H) 241(H) 116(H)     RADIOGRAPHIC STUDIES: I have personally reviewed the radiological images as listed and agreed with the findings in the report. No results found.  ASSESSMENT & PLAN:  47 y.o. male with  1. Thrombocytopenia - stable counts in the 96-107k range with no bleeding issues Unknown etiology ?Immune thrombocytopenia vs due to chronic liver disease vs ETOH.  2. Abnormal transaminases ?related to fatty liver vs ETOH vs autoimmune hepatitis  3. Mild leucopenia WBC count 3.4k. ANC within normal limits at 2k Plan -Discussed patient's most recent labs from 12/13/17, PLT at 96k, AST at 68, ALT at 133, Albumin normal at 4.8 and Bilirubin normal at 1.0 -Discussed that liver inflammation can decrease PLT count  -Recommended that the pt cease binge drinking completely -Cannot rule out an auto-immune inflammation of the liver -Recommend that PCP r/o auto-immune inflammation of the liver -Fatty liver vs ETOH related vs auto-immune related -No anemia, PLT slightly lower at 96k, WBC  borderline low at 3.4k -Continue with multi-vitamin  -If alcohol cessation is achieved for 6 months and PLT continue to drop, would assume an auto-immune process -Will see pt once more in 6 months and if PLT are stable we will discharge pt back to his PCP -Recommended that pt return to care with PCP at [facility] at [facility] for further liver evaluation and following up with GI   3.  Patient Active Problem List   Diagnosis Date Noted  . Elevated antinuclear antibody (ANA) level 01/25/2017  . Thrombocytopenia (HCC) 01/15/2017  . Neutropenia (HCC) 01/15/2017  . Macrocytosis without anemia 01/15/2017  . Fatigue 01/15/2017  . Transaminitis 01/15/2017  Plan -continue mx per PCP   RTC with Dr Candise Che in 6 months with labs   All of the patients questions were answered with apparent satisfaction. The patient knows to call the clinic with any problems, questions or concerns.  The total time spent in the appt was 30 minutes and more than 50% was on counseling and direct patient cares.    Wyvonnia Lora MD MS AAHIVMS [facility] Hematology/Oncology Physician [facility]  (Office):       [phone] (Work cell):  [phone] (Fax):           [phone]  12/14/2017 9:26 AM  I, Marcelline Mates, am acting as a Neurosurgeon for Dr Candise Che.   .I have reviewed the above documentation for accuracy and completeness, and I agree with the above. Johney Maine MD

## 2017-12-14 ENCOUNTER — Encounter: Payer: Self-pay | Admitting: Hematology

## 2017-12-14 ENCOUNTER — Telehealth: Payer: Self-pay | Admitting: Hematology

## 2017-12-14 ENCOUNTER — Inpatient Hospital Stay: Payer: BC Managed Care – PPO | Admitting: Hematology

## 2017-12-14 VITALS — BP 116/79 | HR 70 | Temp 97.7°F | Resp 18 | Ht 73.0 in | Wt 205.2 lb

## 2017-12-14 DIAGNOSIS — D696 Thrombocytopenia, unspecified: Secondary | ICD-10-CM | POA: Diagnosis not present

## 2017-12-14 DIAGNOSIS — K760 Fatty (change of) liver, not elsewhere classified: Secondary | ICD-10-CM

## 2017-12-14 DIAGNOSIS — D72819 Decreased white blood cell count, unspecified: Secondary | ICD-10-CM

## 2017-12-14 NOTE — Telephone Encounter (Signed)
Scheduled appt per 7/2 los - gave patient avs and calender per los.

## 2017-12-15 ENCOUNTER — Ambulatory Visit: Payer: BC Managed Care – PPO | Admitting: Hematology and Oncology

## 2017-12-17 ENCOUNTER — Ambulatory Visit: Payer: BC Managed Care – PPO | Admitting: Hematology

## 2017-12-20 ENCOUNTER — Ambulatory Visit: Payer: BC Managed Care – PPO | Admitting: Psychology

## 2017-12-20 ENCOUNTER — Ambulatory Visit: Payer: BC Managed Care – PPO | Admitting: Hematology

## 2018-06-20 ENCOUNTER — Inpatient Hospital Stay: Payer: BC Managed Care – PPO | Attending: Hematology | Admitting: Hematology

## 2018-06-20 ENCOUNTER — Inpatient Hospital Stay: Payer: BC Managed Care – PPO

## 2018-06-22 ENCOUNTER — Telehealth: Payer: Self-pay | Admitting: Hematology

## 2018-06-22 NOTE — Telephone Encounter (Signed)
Called patient per 1/7 sch message - left message for patient to call back at earliest convenience to r/s missed appt.

## 2018-09-29 ENCOUNTER — Other Ambulatory Visit: Payer: Self-pay | Admitting: Gastroenterology

## 2018-09-29 DIAGNOSIS — R7989 Other specified abnormal findings of blood chemistry: Secondary | ICD-10-CM

## 2018-09-29 DIAGNOSIS — R945 Abnormal results of liver function studies: Secondary | ICD-10-CM

## 2018-09-29 DIAGNOSIS — K580 Irritable bowel syndrome with diarrhea: Secondary | ICD-10-CM

## 2018-11-15 ENCOUNTER — Other Ambulatory Visit: Payer: Self-pay

## 2018-11-21 IMAGING — US US ABDOMEN COMPLETE
1 series · 14 of 25 positions shown · non-contrast
Comparison: None.

CLINICAL DATA: Generalized abdominal pain.

EXAM:
ABDOMEN ULTRASOUND COMPLETE

[Series 1: us abdomen complete · 0.22mm/px · 14 of 88 slices shown]
[im 1/88]
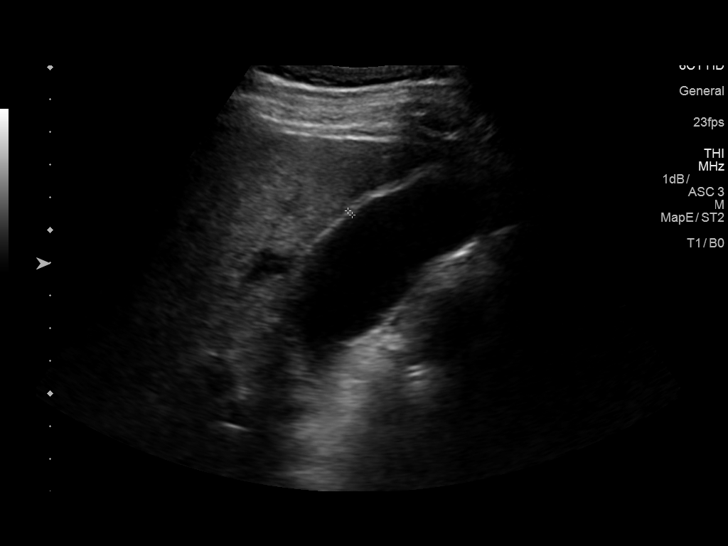
[im 8/88]
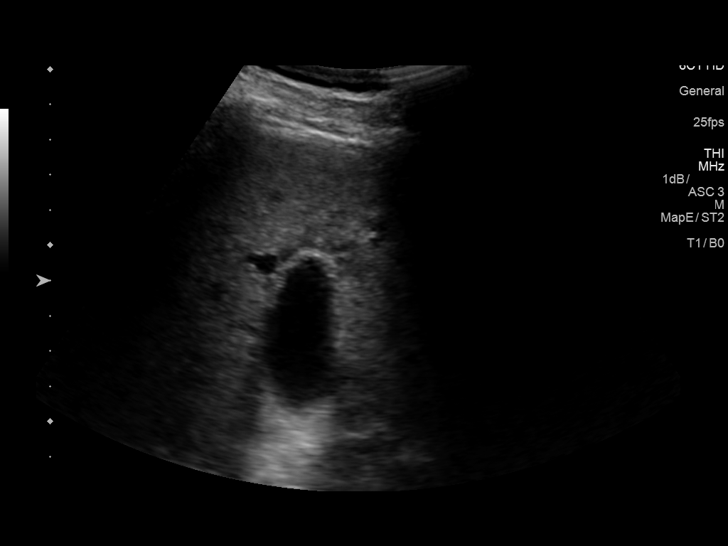
[im 15/88]
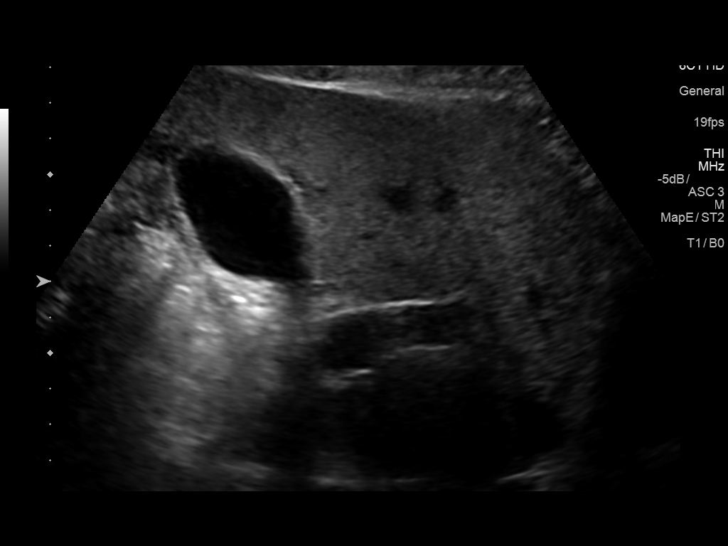
[im 22/88]
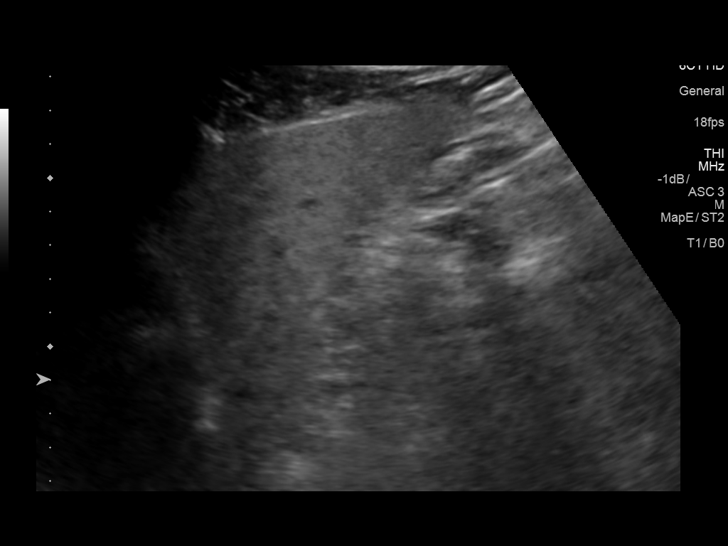
[im 30/88]
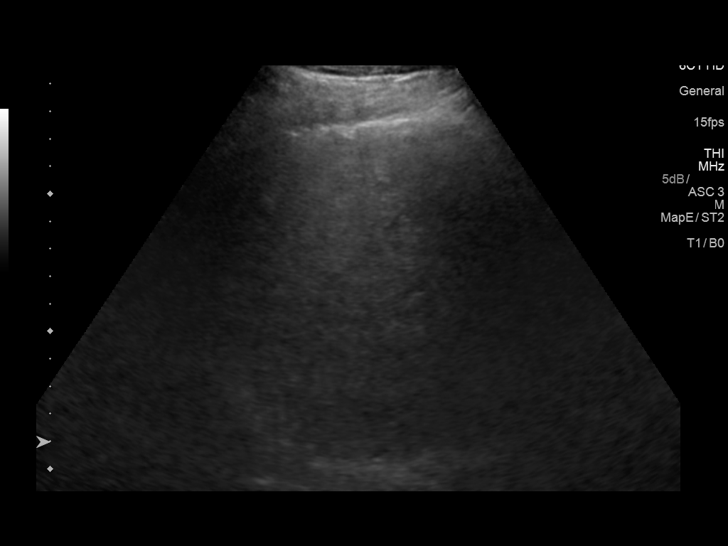
[im 33/88]
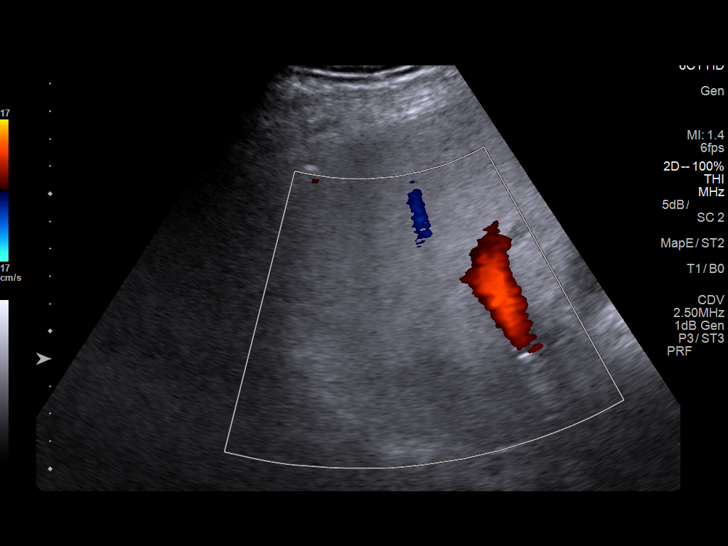
[im 40/88]
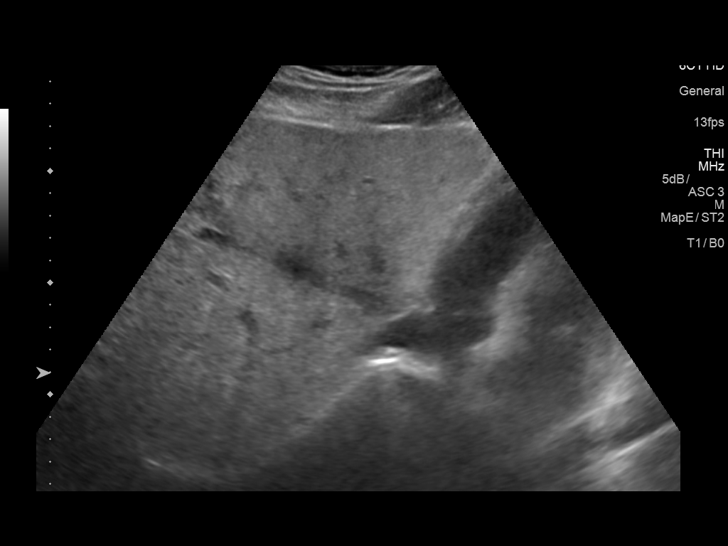
[im 48/88]
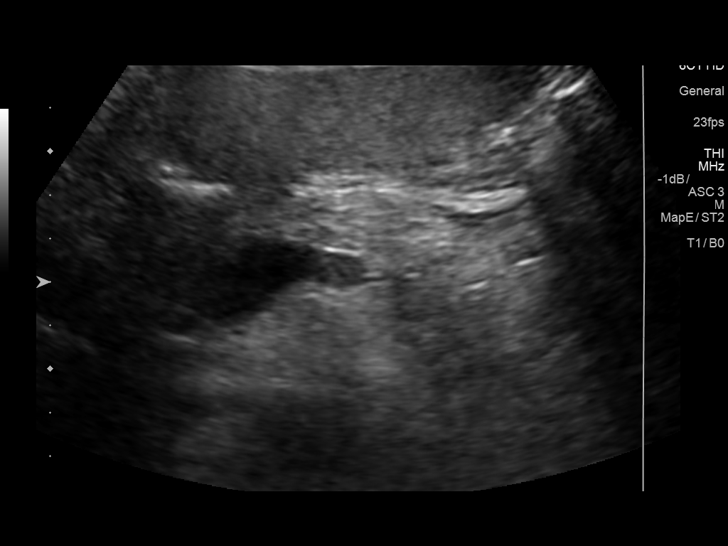
[im 55/88]
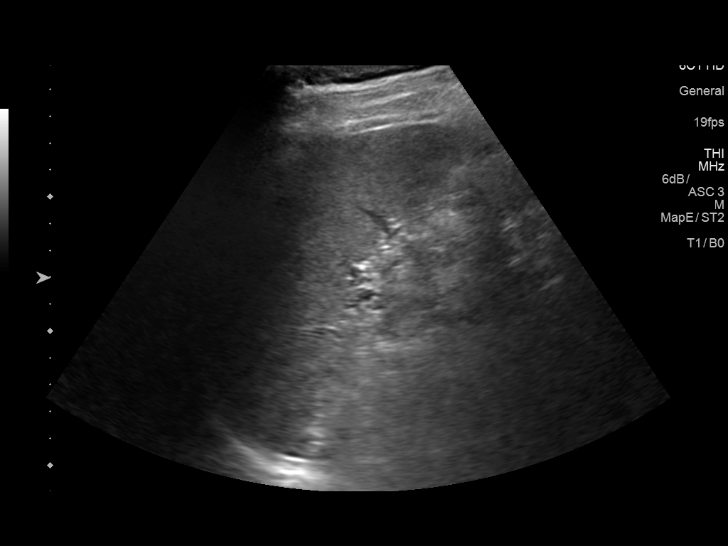
[im 59/88]
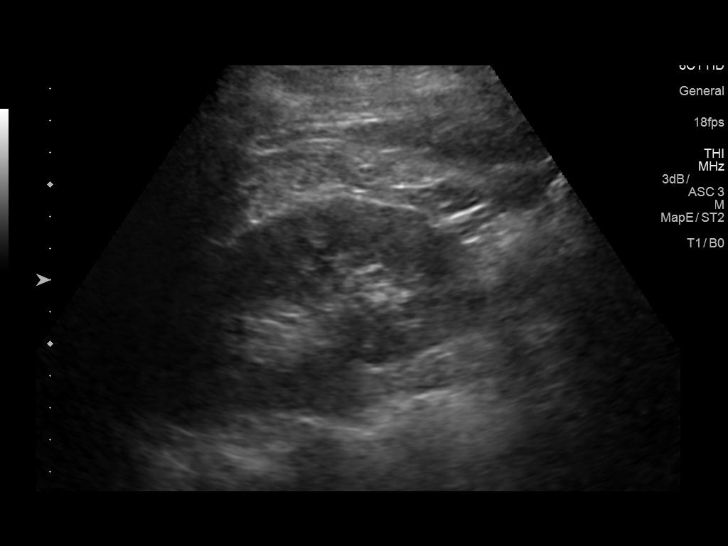
[im 66/88]
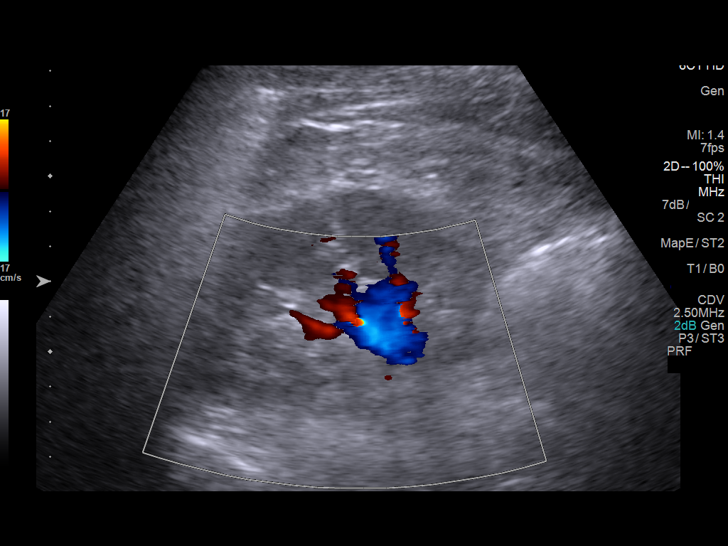
[im 73/88]
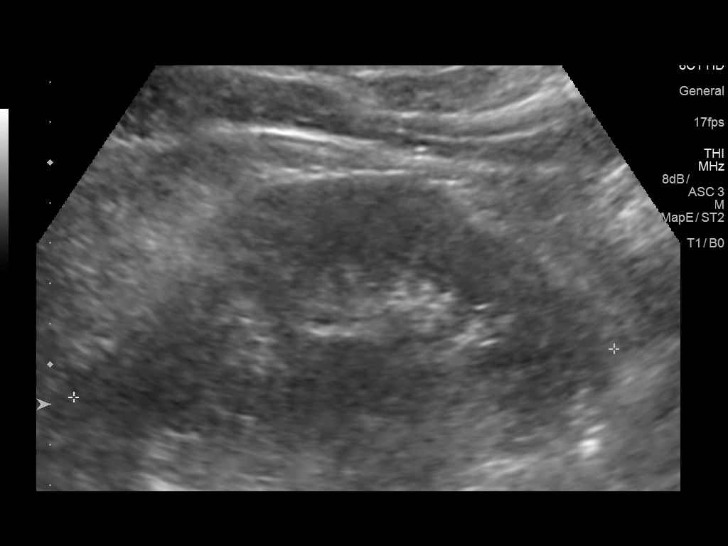
[im 80/88]
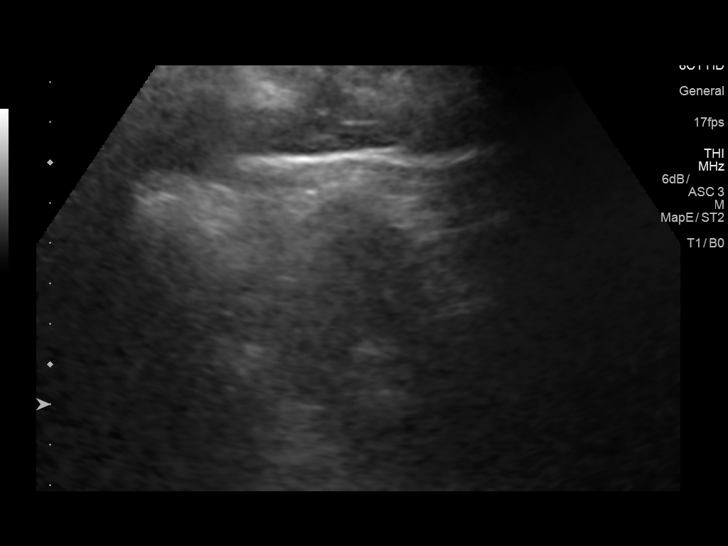
[im 88/88]
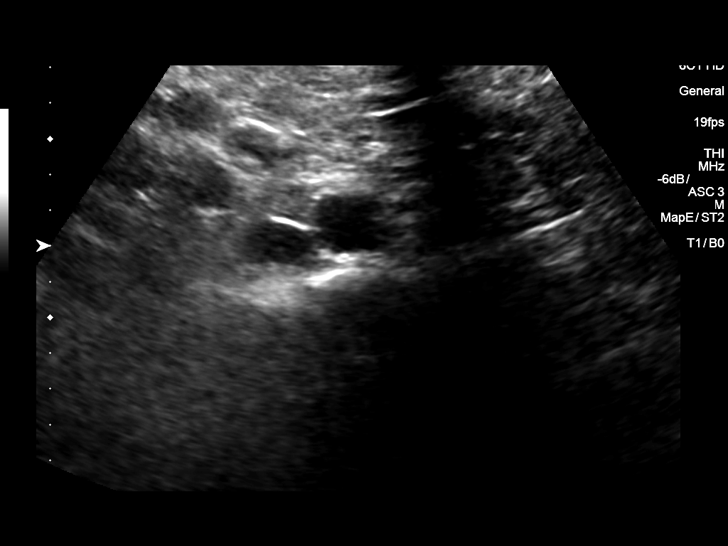

[14 of 25 positions shown; findings below may reference images not displayed]

FINDINGS: Gallbladder: No gallstones or wall thickening visualized. No
sonographic Murphy sign noted by sonographer.

Common bile duct: Diameter: 3 mm.

Liver: The liver demonstrates coarse echotexture and increased
echogenicity, likely reflecting diffuse steatosis. No overt
cirrhotic contour abnormalities or focal lesions are identified.
There is no evidence of intrahepatic biliary ductal dilatation. The
portal vein is open and demonstrates hepatopetal flow.

IVC: No abnormality visualized.

Pancreas: Visualized portion unremarkable.

Spleen: Size and appearance within normal limits.

Right Kidney: Length: 11.7 cm. Echogenicity within normal limits. No
mass or hydronephrosis visualized.

Left Kidney: Length: 12.2 cm. Echogenicity within normal limits. No
mass or hydronephrosis visualized.

Abdominal aorta: No aneurysm visualized.

Other findings: No ascites visualized.
IMPRESSION: Evidence of hepatic steatosis by ultrasound.

## 2018-11-25 ENCOUNTER — Ambulatory Visit
Admission: RE | Admit: 2018-11-25 | Discharge: 2018-11-25 | Disposition: A | Discharge status: Home / Self Care | Payer: BC Managed Care – PPO | Source: Ambulatory Visit | Attending: Gastroenterology | Admitting: Gastroenterology

## 2018-11-25 DIAGNOSIS — K580 Irritable bowel syndrome with diarrhea: Secondary | ICD-10-CM

## 2018-11-25 DIAGNOSIS — R7989 Other specified abnormal findings of blood chemistry: Secondary | ICD-10-CM

## 2018-11-25 DIAGNOSIS — R945 Abnormal results of liver function studies: Secondary | ICD-10-CM

## 2019-04-11 ENCOUNTER — Other Ambulatory Visit: Payer: Self-pay | Admitting: Family Medicine

## 2019-04-11 ENCOUNTER — Ambulatory Visit
Admission: RE | Admit: 2019-04-11 | Discharge: 2019-04-11 | Disposition: A | Discharge status: Home / Self Care | Payer: BC Managed Care – PPO | Source: Ambulatory Visit | Attending: Family Medicine | Admitting: Family Medicine

## 2019-04-11 DIAGNOSIS — R0602 Shortness of breath: Secondary | ICD-10-CM

## 2019-08-10 ENCOUNTER — Ambulatory Visit: Payer: BC Managed Care – PPO | Attending: Internal Medicine

## 2019-08-10 DIAGNOSIS — Z23 Encounter for immunization: Secondary | ICD-10-CM | POA: Insufficient documentation

## 2019-08-10 NOTE — Progress Notes (Signed)
   Covid-19 Vaccination Clinic  Name:  Craig Macias    MRN: [id number] DOB: 11/30/1970  08/10/2019  [name] was observed post Covid-19 immunization for 15 minutes without incidence. He was provided with Vaccine Information Sheet and instruction to access the V-Safe system.   [name] was instructed to call 911 with any severe reactions post vaccine: Marland Kitchen Difficulty breathing  . Swelling of your face and throat  . A fast heartbeat  . A bad rash all over your body  . Dizziness and weakness    Immunizations Administered    Name Date Dose VIS Date Route   Pfizer COVID-19 Vaccine 08/10/2019  9:15 AM 0.3 mL 05/26/2019 Intramuscular   Manufacturer: [employer], [employer]   Lot: [id number]   NDC: [address]-6

## 2019-09-01 ENCOUNTER — Telehealth: Payer: Self-pay | Admitting: Physician Assistant

## 2019-09-01 NOTE — Telephone Encounter (Signed)
Called to discuss with [name] about Covid symptoms and the use of bamlanivimab or casirivimab/imdevimab, a monoclonal antibody infusion for those with mild to moderate Covid symptoms and at a high risk of hospitalization.       Pt is not qualified for this infusion due to lack of identified risk factors and co-morbid conditions.  Symptoms reviewed as well as criteria for ending isolation.  Symptoms reviewed that would warrant ED/Hospital evaluation as well should her condition worsen. Preventative practices reviewed. Patient verbalized understanding.    Patient Active Problem List   Diagnosis Date Noted  . Elevated antinuclear antibody (ANA) level 01/25/2017  . Thrombocytopenia (HCC) 01/15/2017  . Neutropenia (HCC) 01/15/2017  . Macrocytosis without anemia 01/15/2017  . Fatigue 01/15/2017  . Transaminitis 01/15/2017    Cline Crock PA-C

## 2019-09-05 ENCOUNTER — Ambulatory Visit: Payer: BC Managed Care – PPO | Attending: Internal Medicine

## 2021-06-04 IMAGING — US ULTRASOUND ABDOMEN COMPLETE
1 series · 14 of 25 positions shown · non-contrast
Comparison: Ultrasound December 28, 2016.

CLINICAL DATA: Abnormal liver function tests.

EXAM:
ABDOMEN ULTRASOUND COMPLETE

[Series 1: ultrasound abdomen complete · 0.19mm/px · 14 of 74 slices shown]
[im 1/74]
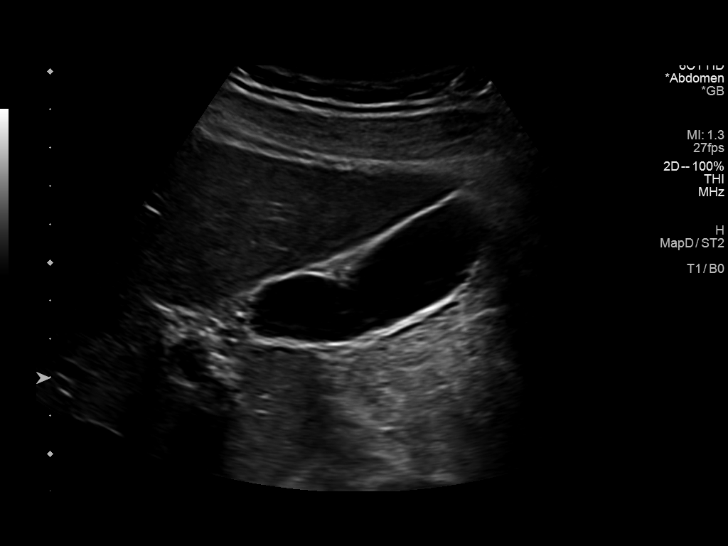
[im 7/74]
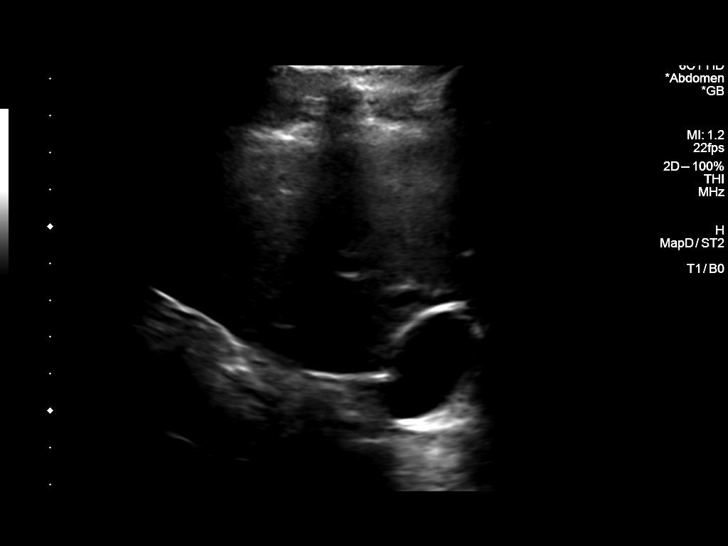
[im 13/74]
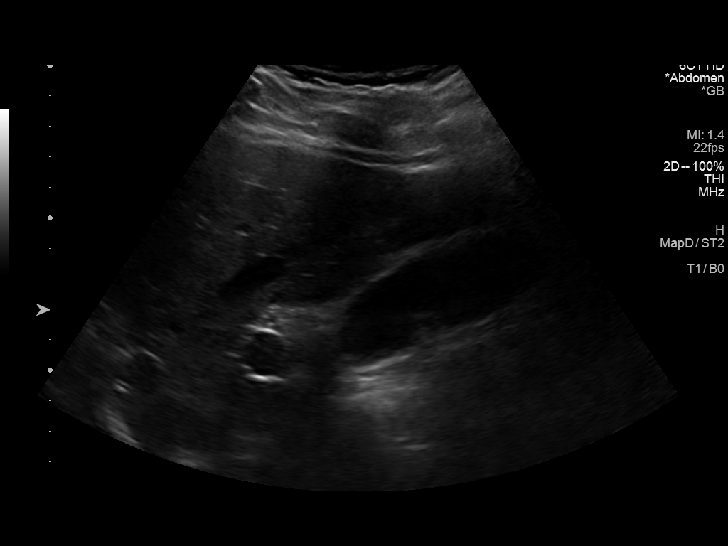
[im 19/74]
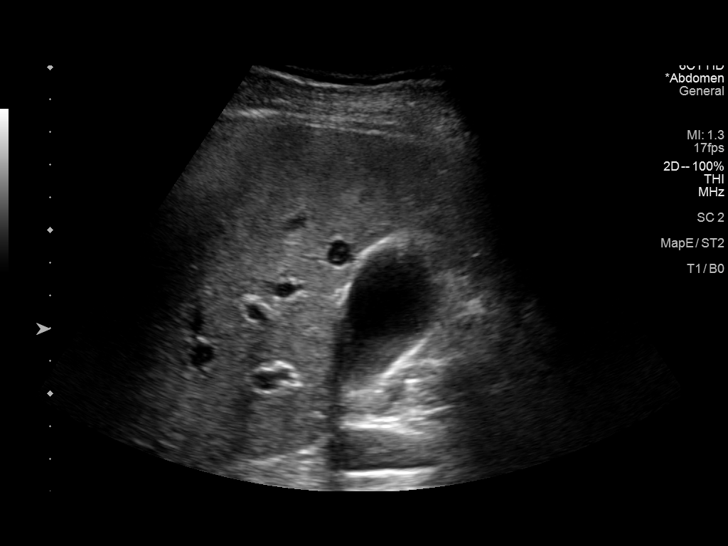
[im 25/74]
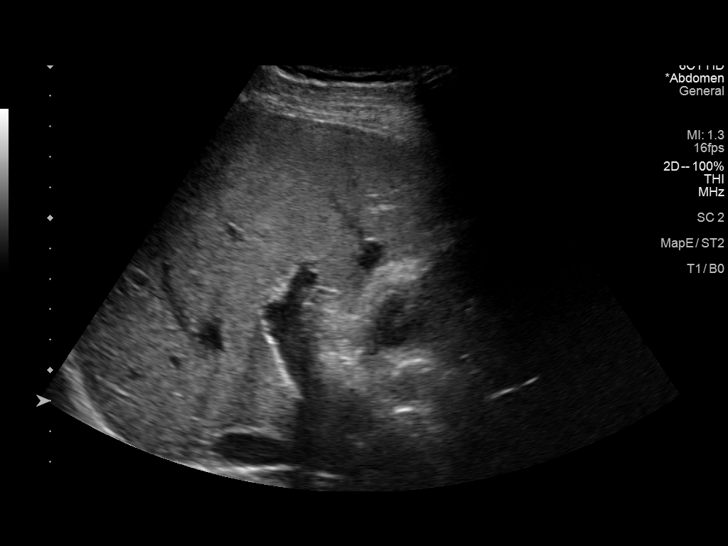
[im 28/74]
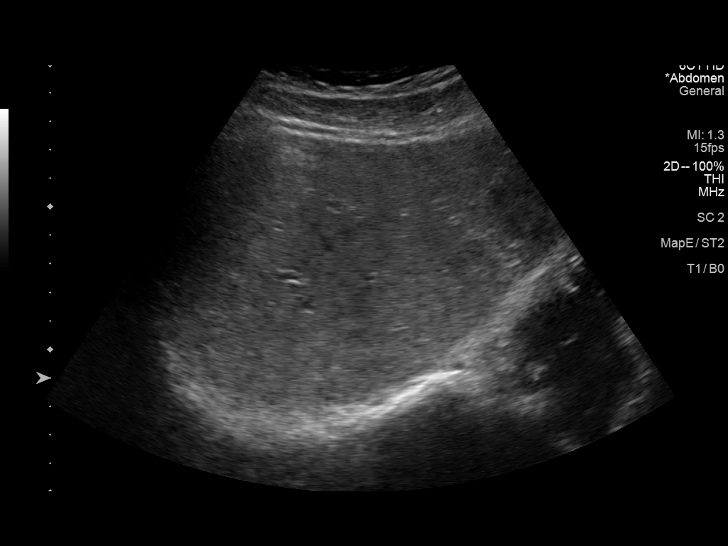
[im 34/74]
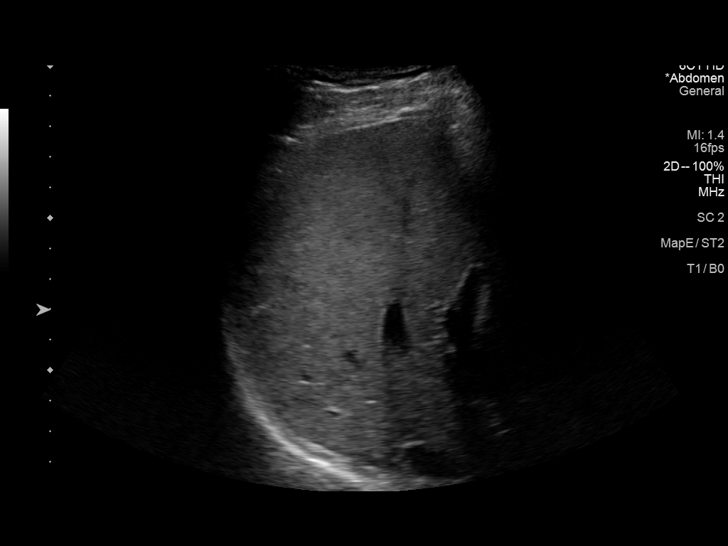
[im 40/74]
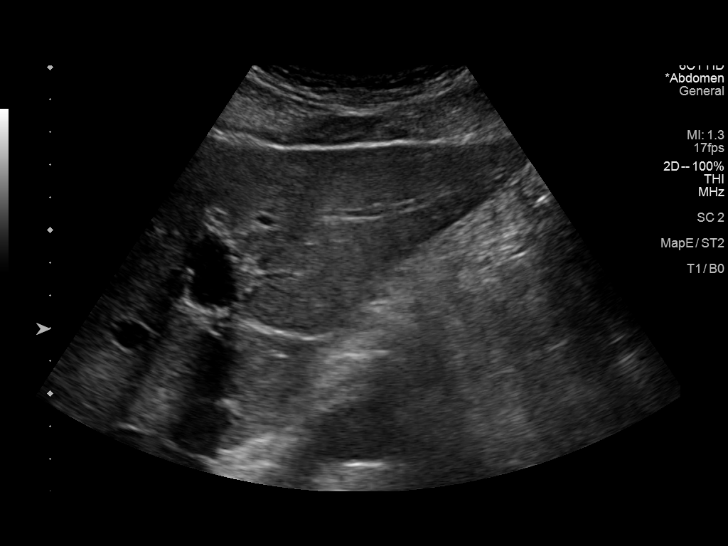
[im 46/74]
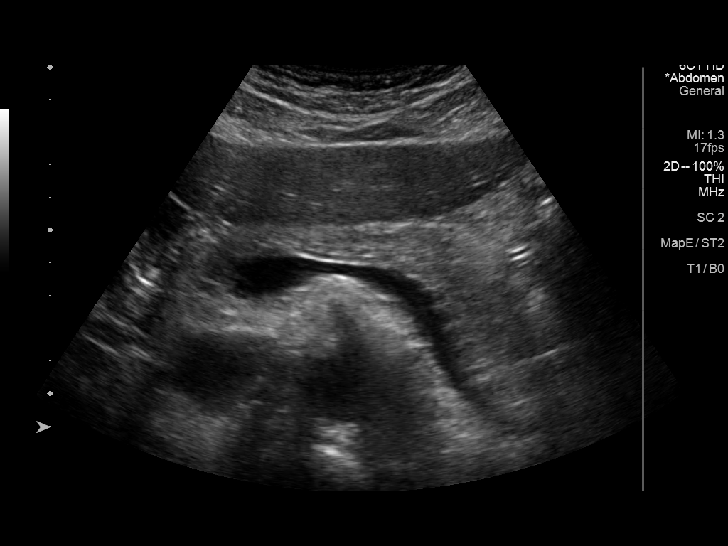
[im 49/74]
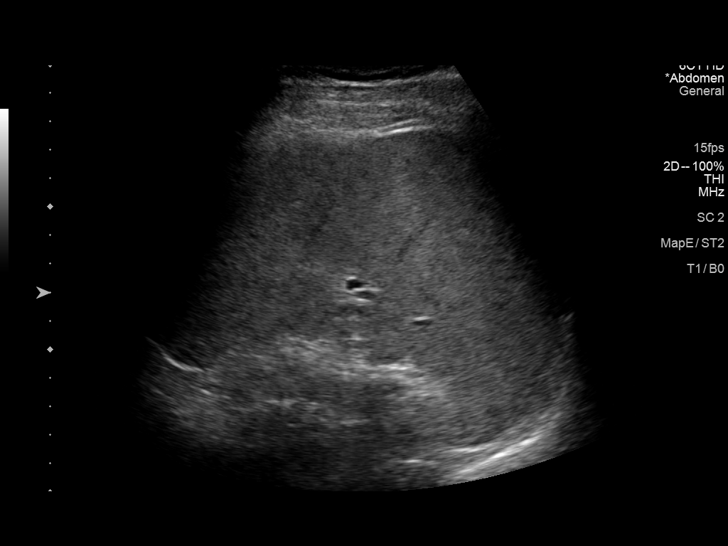
[im 55/74]
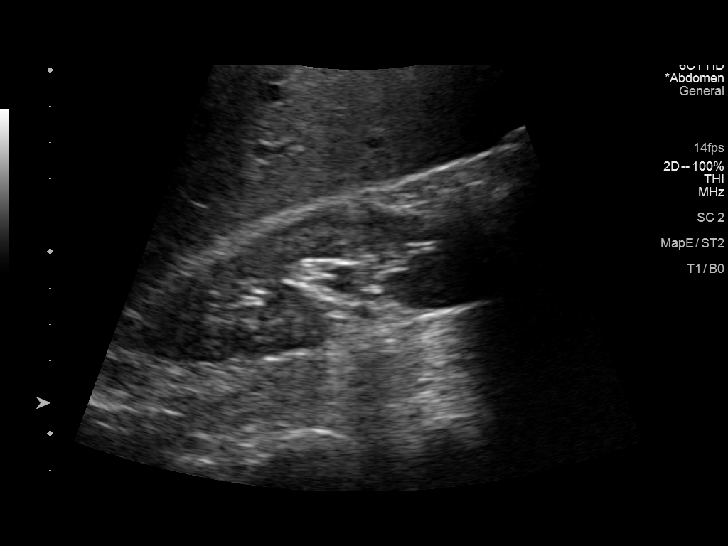
[im 61/74]
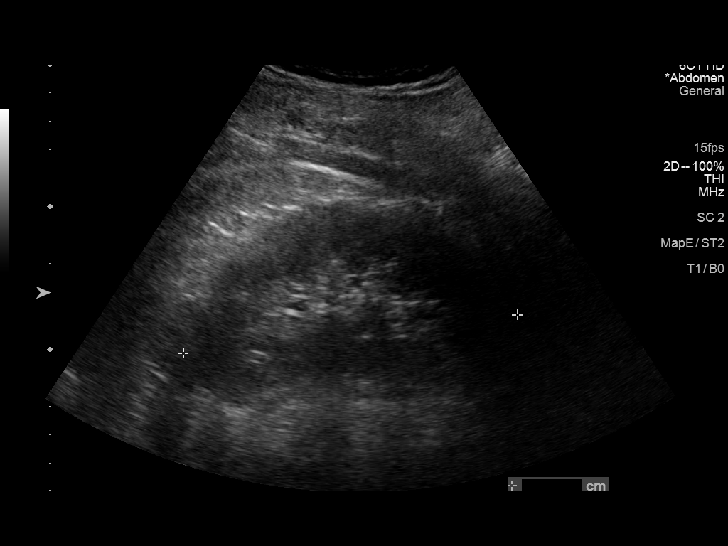
[im 67/74]
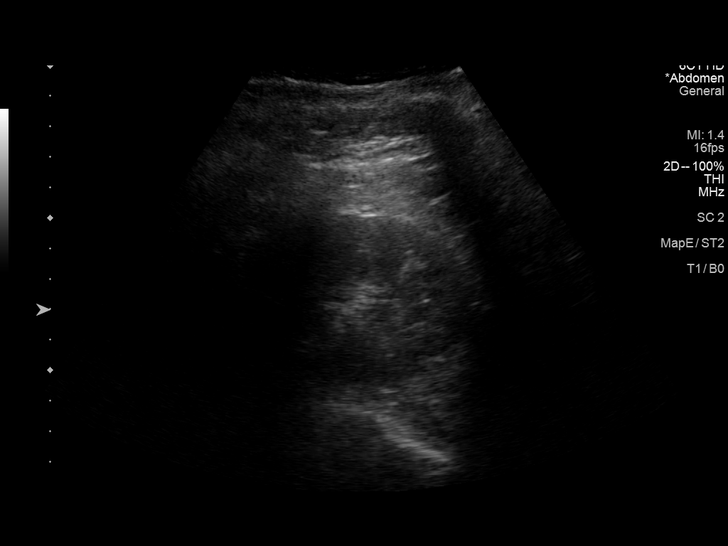
[im 74/74]
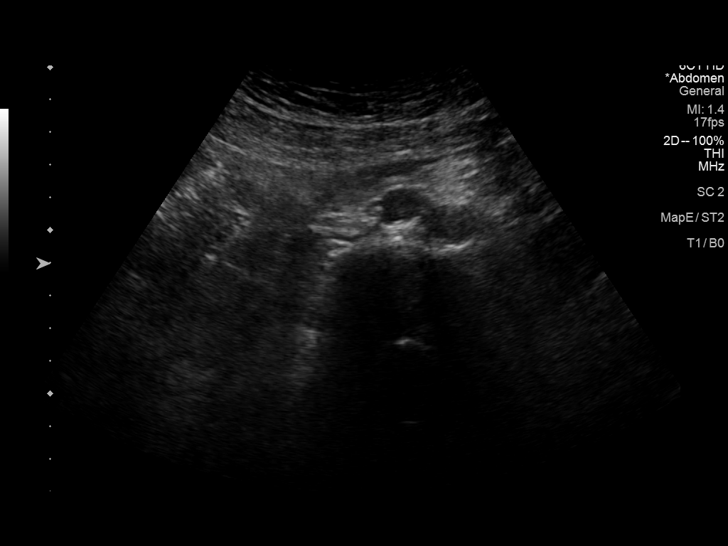

[14 of 25 positions shown; findings below may reference images not displayed]

FINDINGS: Gallbladder: No gallstones or wall thickening visualized. No
sonographic Murphy sign noted by sonographer.

Common bile duct: Diameter: 3 mm which is within normal limits.

Liver: No focal lesion identified. Mildly increased echogenicity of
hepatic parenchyma is noted. Portal vein is patent on color Doppler
imaging with normal direction of blood flow towards the liver.

IVC: No abnormality visualized.

Pancreas: Visualized portion unremarkable.

Spleen: Size and appearance within normal limits.

Right Kidney: Length: 11.3 cm. Echogenicity within normal limits. No
mass or hydronephrosis visualized.

Left Kidney: Length: 11.8 cm. Echogenicity within normal limits. No
mass or hydronephrosis visualized.

Abdominal aorta: No aneurysm visualized.

Other findings: None.
IMPRESSION: Mildly increased echogenicity of hepatic parenchyma is noted
consistent with hepatic steatosis. No other abnormality seen in the
abdomen.

## 2021-09-11 ENCOUNTER — Telehealth: Payer: BC Managed Care – PPO | Admitting: Physician Assistant

## 2021-09-11 DIAGNOSIS — J019 Acute sinusitis, unspecified: Secondary | ICD-10-CM

## 2021-09-11 DIAGNOSIS — B9689 Other specified bacterial agents as the cause of diseases classified elsewhere: Secondary | ICD-10-CM

## 2021-09-11 MED ORDER — AZITHROMYCIN 250 MG PO TABS: ORAL_TABLET | ORAL | 0 refills | Status: AC

## 2021-09-11 NOTE — Progress Notes (Signed)
I have spent 5 minutes in review of e-visit questionnaire, review and updating patient chart, medical decision making and response to patient.   Korban Shearer Cody Shamarie Call, PA-C    

## 2021-09-11 NOTE — Progress Notes (Signed)
E-Visit for Sinus Problems ? ?We are sorry that you are not feeling well.  Here is how we plan to help! ? ?Based on what you have shared with me it looks like you have sinusitis.  Sinusitis is inflammation and infection in the sinus cavities of the head.  Based on your presentation I believe you most likely have Acute Bacterial Sinusitis.  This is an infection caused by bacteria and is treated with antibiotics. I have prescribed  Azithromycin to take as directed since you are penicillin-allergic and note other medications have not worked as well in the past. You may use an oral decongestant such as Mucinex D or if you have glaucoma or high blood pressure use plain Mucinex. Saline nasal spray help and can safely be used as often as needed for congestion.  If you develop worsening sinus pain, fever or notice severe headache and vision changes, or if symptoms are not better after completion of antibiotic, please schedule an appointment with a health care provider.   ? ?Sinus infections are not as easily transmitted as other respiratory infection, however we still recommend that you avoid close contact with loved ones, especially the very young and elderly.  Remember to wash your hands thoroughly throughout the day as this is the number one way to prevent the spread of infection! ? ?Home Care: ?Only take medications as instructed by your medical team. ?Complete the entire course of an antibiotic. ?Do not take these medications with alcohol. ?A steam or ultrasonic humidifier can help congestion.  You can place a towel over your head and breathe in the steam from hot water coming from a faucet. ?Avoid close contacts especially the very young and the elderly. ?Cover your mouth when you cough or sneeze. ?Always remember to wash your hands. ? ?Get Help Right Away If: ?You develop worsening fever or sinus pain. ?You develop a severe head ache or visual changes. ?Your symptoms persist after you have completed your treatment  plan. ? ?Make sure you ?Understand these instructions. ?Will watch your condition. ?Will get help right away if you are not doing well or get worse. ? ?Thank you for choosing an e-visit. ? ?Your e-visit answers were reviewed by a board certified advanced clinical practitioner to complete your personal care plan. Depending upon the condition, your plan could have included both over the counter or prescription medications. ? ?Please review your pharmacy choice. Make sure the pharmacy is open so you can pick up prescription now. If there is a problem, you may contact your provider through [employer] and have the prescription routed to another pharmacy.  Your safety is important to [place]. If you have drug allergies check your prescription carefully.  ? ?For the next 24 hours you can use MyChart to ask questions about today's visit, request a non-urgent call back, or ask for a work or school excuse. ?You will get an email in the next two days asking about your experience. I hope that your e-visit has been valuable and will speed your recovery. ? ?

## 2021-10-19 IMAGING — DX DG CHEST 2V
2 series · 2 of 2 positions shown · non-contrast
Comparison: None

CLINICAL DATA: Shortness of breath for 2 days, [id number] test
pending

EXAM:
CHEST - 2 VIEW

[dg chest 2 view (1 of 2)]
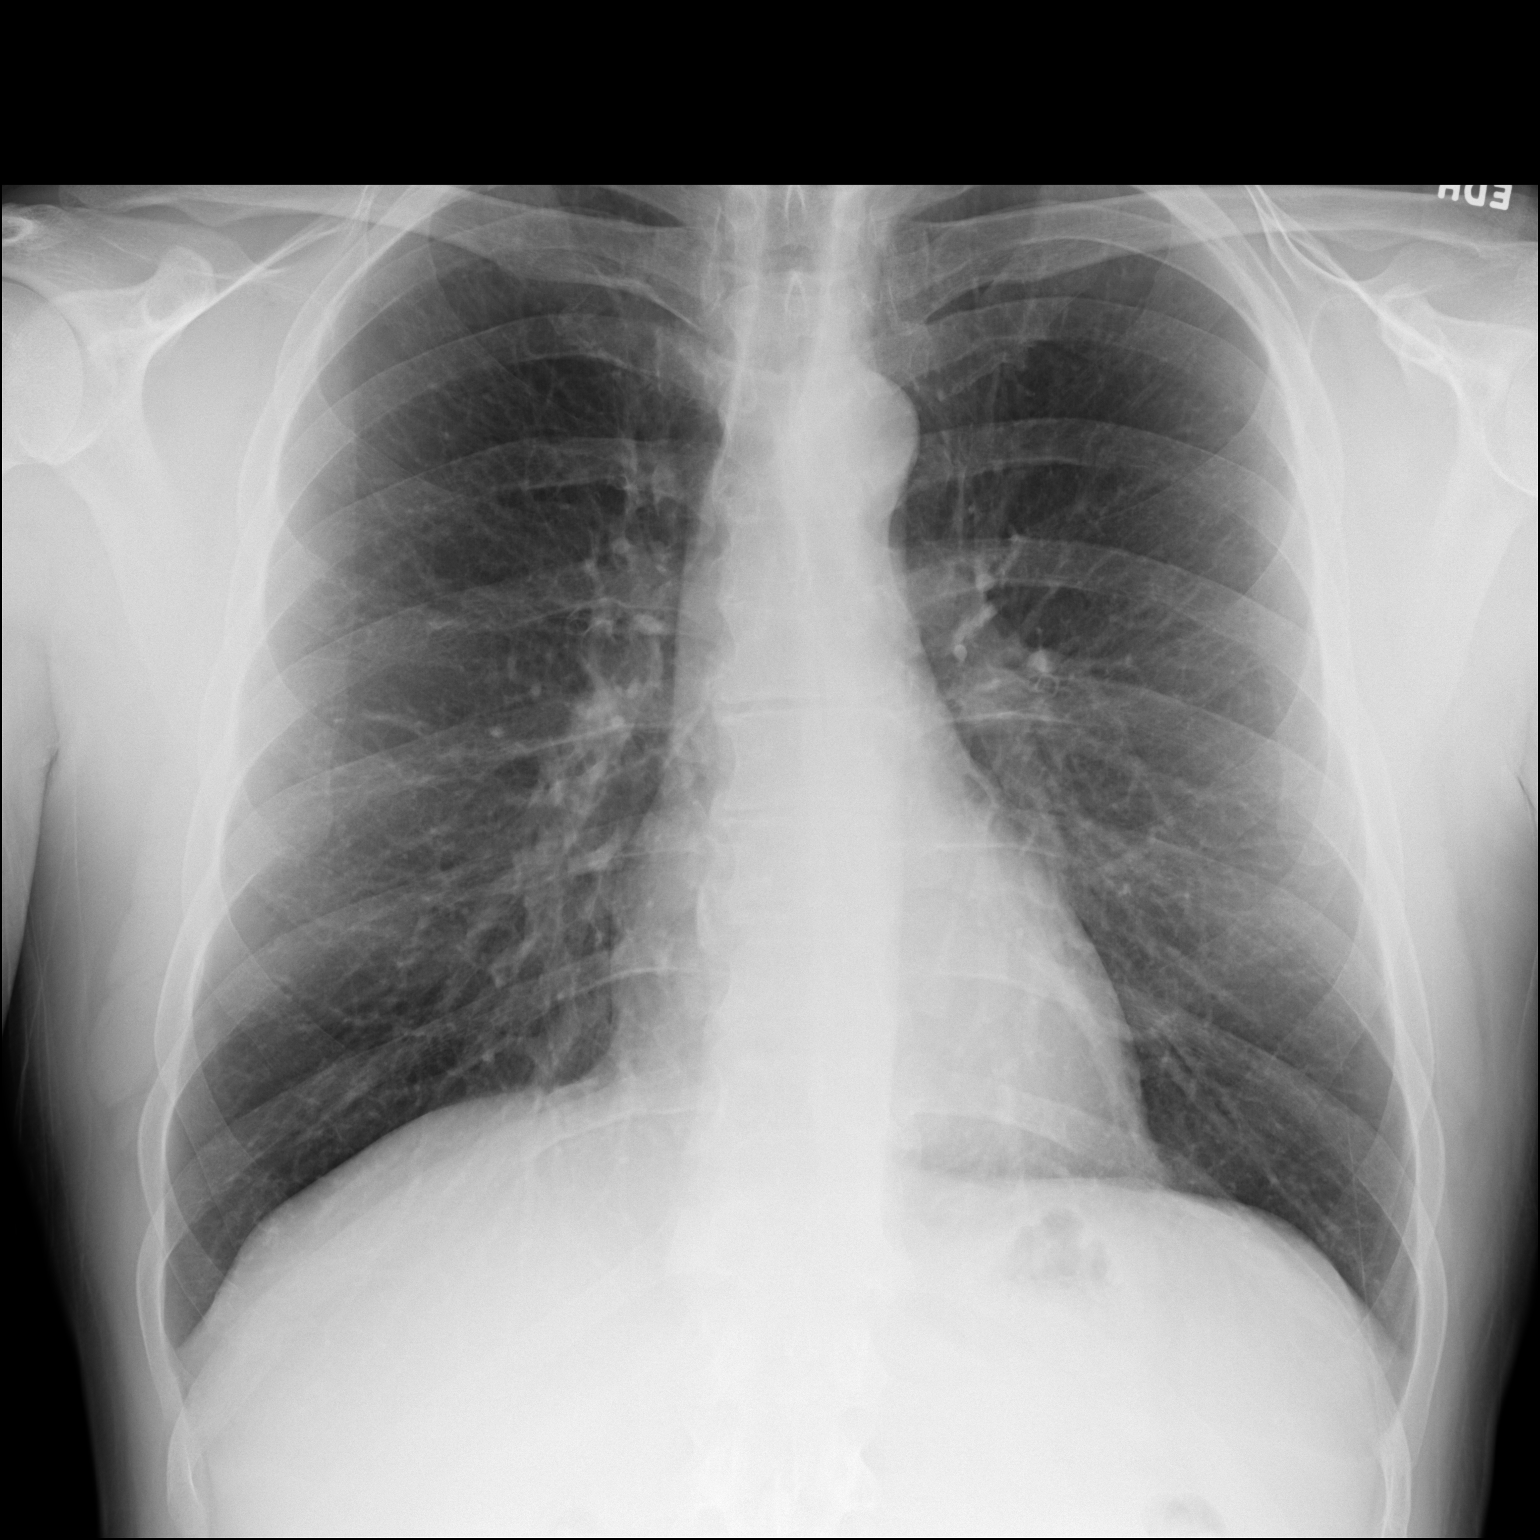

[dg chest 2 view (2 of 2)]
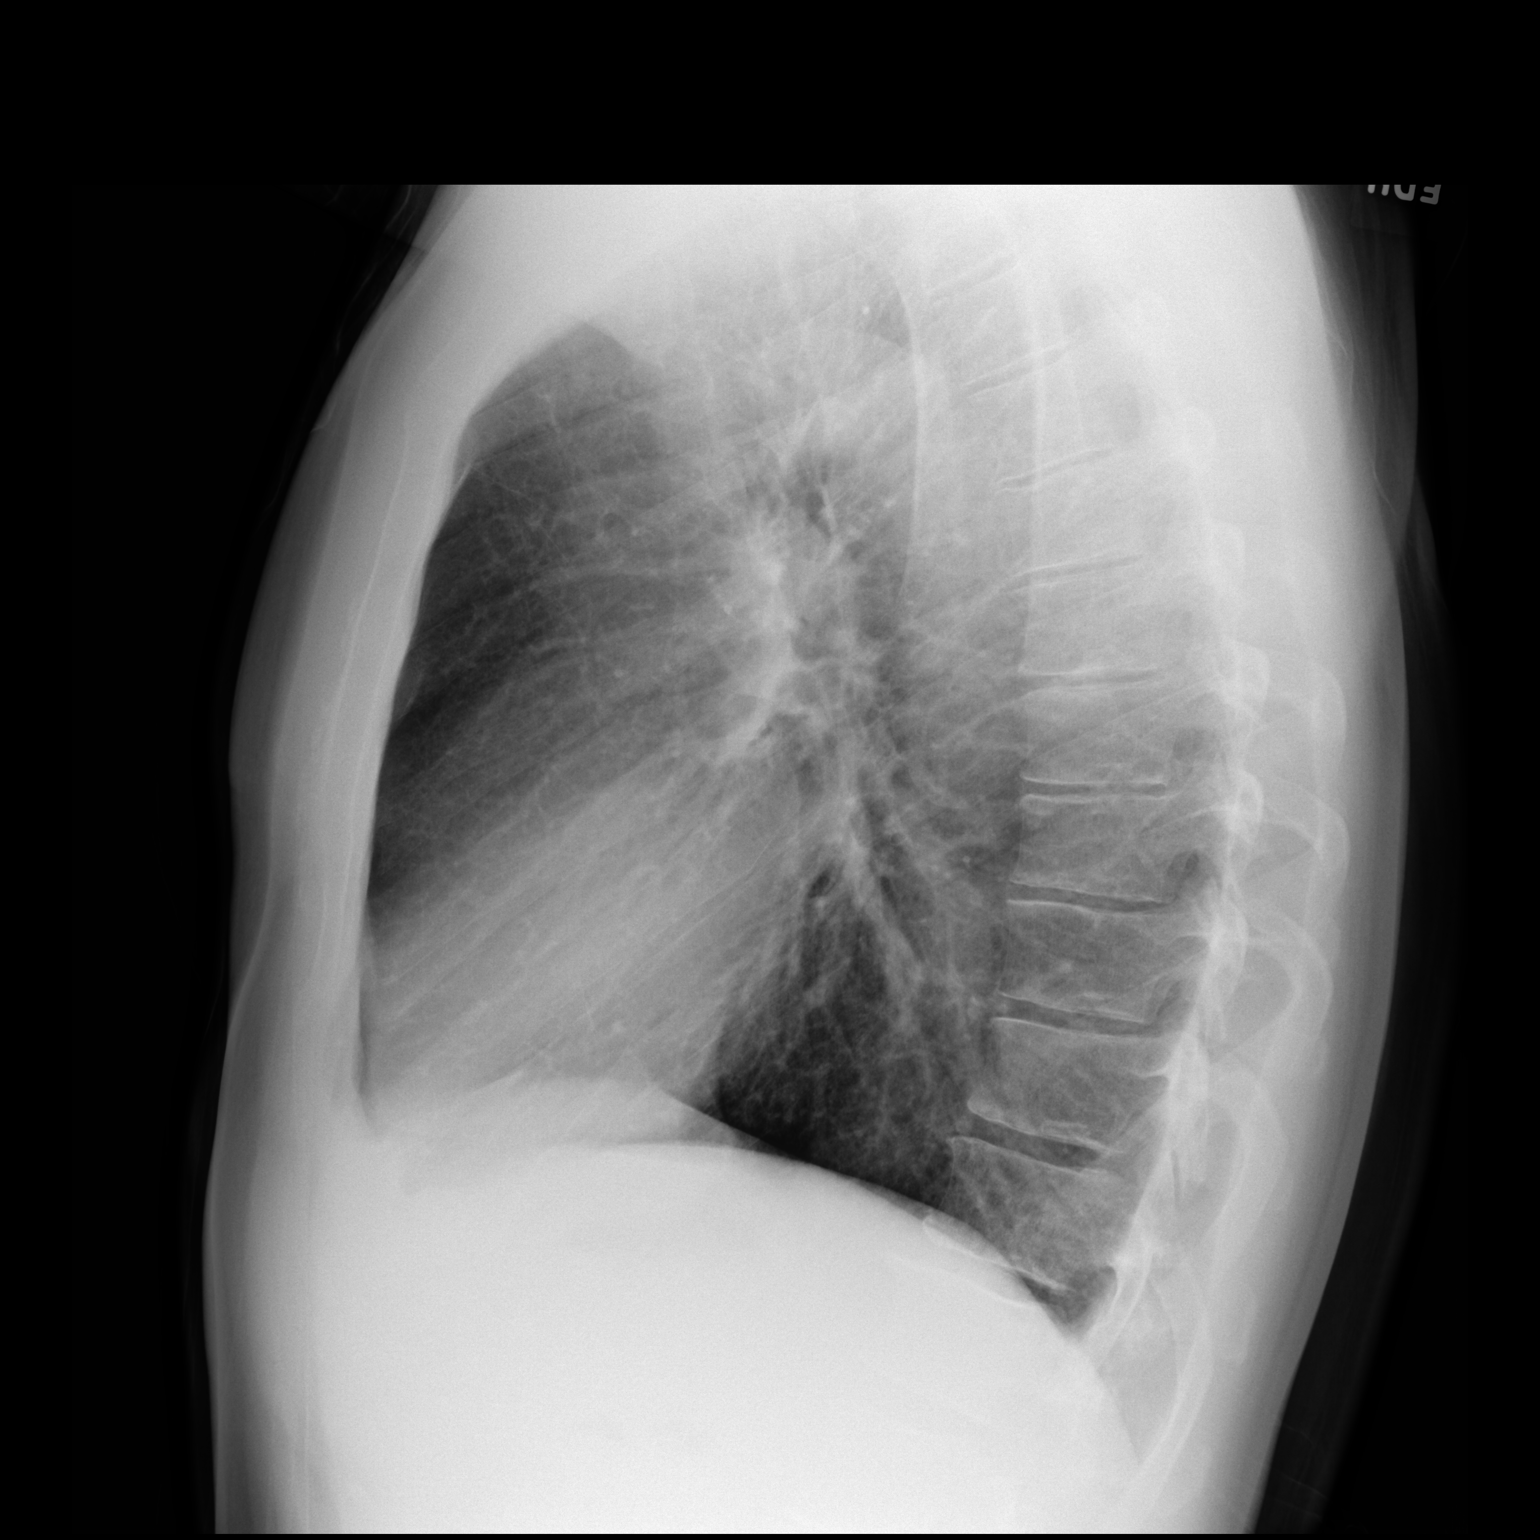

[2 of 2 positions shown; findings below may reference images not displayed]

FINDINGS: Normal heart size, mediastinal contours, and pulmonary vascularity.

Mild peribronchial thickening centrally.

Lungs clear.

No infiltrate, pleural effusion or pneumothorax.

Bones unremarkable.
IMPRESSION: Mild bronchitic changes without infiltrate.

## 2022-10-27 ENCOUNTER — Inpatient Hospital Stay (HOSPITAL_COMMUNITY)
Admission: EM | Admit: 2022-10-27 | Discharge: 2022-10-30 | DRG: 321 | Disposition: A | Discharge status: Home / Self Care | Payer: BC Managed Care – PPO | Attending: Cardiology | Admitting: Cardiology

## 2022-10-27 ENCOUNTER — Other Ambulatory Visit: Payer: Self-pay

## 2022-10-27 ENCOUNTER — Inpatient Hospital Stay (HOSPITAL_COMMUNITY): Payer: BC Managed Care – PPO

## 2022-10-27 ENCOUNTER — Encounter (HOSPITAL_COMMUNITY): Payer: Self-pay | Admitting: Emergency Medicine

## 2022-10-27 ENCOUNTER — Emergency Department (HOSPITAL_COMMUNITY): Payer: BC Managed Care – PPO

## 2022-10-27 DIAGNOSIS — Z79899 Other long term (current) drug therapy: Secondary | ICD-10-CM

## 2022-10-27 DIAGNOSIS — R509 Fever, unspecified: Secondary | ICD-10-CM | POA: Diagnosis not present

## 2022-10-27 DIAGNOSIS — Z823 Family history of stroke: Secondary | ICD-10-CM

## 2022-10-27 DIAGNOSIS — I2119 ST elevation (STEMI) myocardial infarction involving other coronary artery of inferior wall: Secondary | ICD-10-CM | POA: Diagnosis present

## 2022-10-27 DIAGNOSIS — I110 Hypertensive heart disease with heart failure: Secondary | ICD-10-CM | POA: Diagnosis present

## 2022-10-27 DIAGNOSIS — R7401 Elevation of levels of liver transaminase levels: Secondary | ICD-10-CM | POA: Diagnosis not present

## 2022-10-27 DIAGNOSIS — D72829 Elevated white blood cell count, unspecified: Secondary | ICD-10-CM | POA: Diagnosis present

## 2022-10-27 DIAGNOSIS — I2111 ST elevation (STEMI) myocardial infarction involving right coronary artery: Secondary | ICD-10-CM

## 2022-10-27 DIAGNOSIS — D696 Thrombocytopenia, unspecified: Secondary | ICD-10-CM

## 2022-10-27 DIAGNOSIS — E785 Hyperlipidemia, unspecified: Secondary | ICD-10-CM | POA: Diagnosis present

## 2022-10-27 DIAGNOSIS — I5021 Acute systolic (congestive) heart failure: Secondary | ICD-10-CM | POA: Diagnosis not present

## 2022-10-27 DIAGNOSIS — D6959 Other secondary thrombocytopenia: Secondary | ICD-10-CM | POA: Diagnosis present

## 2022-10-27 DIAGNOSIS — Z87891 Personal history of nicotine dependence: Secondary | ICD-10-CM

## 2022-10-27 DIAGNOSIS — I214 Non-ST elevation (NSTEMI) myocardial infarction: Principal | ICD-10-CM

## 2022-10-27 DIAGNOSIS — I959 Hypotension, unspecified: Secondary | ICD-10-CM | POA: Diagnosis not present

## 2022-10-27 DIAGNOSIS — I071 Rheumatic tricuspid insufficiency: Secondary | ICD-10-CM | POA: Diagnosis present

## 2022-10-27 DIAGNOSIS — Z955 Presence of coronary angioplasty implant and graft: Secondary | ICD-10-CM

## 2022-10-27 DIAGNOSIS — R079 Chest pain, unspecified: Secondary | ICD-10-CM

## 2022-10-27 DIAGNOSIS — I2510 Atherosclerotic heart disease of native coronary artery without angina pectoris: Secondary | ICD-10-CM | POA: Diagnosis present

## 2022-10-27 DIAGNOSIS — I213 ST elevation (STEMI) myocardial infarction of unspecified site: Secondary | ICD-10-CM

## 2022-10-27 DIAGNOSIS — Z8249 Family history of ischemic heart disease and other diseases of the circulatory system: Secondary | ICD-10-CM

## 2022-10-27 DIAGNOSIS — R519 Headache, unspecified: Secondary | ICD-10-CM | POA: Diagnosis present

## 2022-10-27 DIAGNOSIS — K760 Fatty (change of) liver, not elsewhere classified: Secondary | ICD-10-CM | POA: Diagnosis present

## 2022-10-27 LAB — CBC WITH DIFFERENTIAL/PLATELET
Abs Immature Granulocytes: 0.05 10*3/uL (ref 0.00–0.07)
Basophils Absolute: 0 10*3/uL (ref 0.0–0.1)
Basophils Relative: 0 %
Eosinophils Absolute: 0 10*3/uL (ref 0.0–0.5)
Eosinophils Relative: 0 %
HCT: 44.5 % (ref 39.0–52.0)
Hemoglobin: 15.9 g/dL (ref 13.0–17.0)
Immature Granulocytes: 0 %
Lymphocytes Relative: 9 %
Lymphs Abs: 1.1 10*3/uL (ref 0.7–4.0)
MCH: 34.3 pg — ABNORMAL HIGH (ref 26.0–34.0)
MCHC: 35.7 g/dL (ref 30.0–36.0)
MCV: 95.9 fL (ref 80.0–100.0)
Monocytes Absolute: 1.5 10*3/uL — ABNORMAL HIGH (ref 0.1–1.0)
Monocytes Relative: 12 %
Neutro Abs: 9.6 10*3/uL — ABNORMAL HIGH (ref 1.7–7.7)
Neutrophils Relative %: 79 %
Platelets: 91 10*3/uL — ABNORMAL LOW (ref 150–400)
RBC: 4.64 MIL/uL (ref 4.22–5.81)
RDW: 12.5 % (ref 11.5–15.5)
WBC: 12.2 10*3/uL — ABNORMAL HIGH (ref 4.0–10.5)
nRBC: 0 % (ref 0.0–0.2)

## 2022-10-27 LAB — ECHOCARDIOGRAM COMPLETE
AR max vel: 3.94 cm2
AV Area VTI: 3.94 cm2
AV Area mean vel: 3.7 cm2
AV Mean grad: 3 mmHg
AV Peak grad: 5.3 mmHg
Ao pk vel: 1.15 m/s
Area-P 1/2: 2.59 cm2
Calc EF: 53.3 %
Height: 73 in
S' Lateral: 2.75 cm
Single Plane A2C EF: 54.5 %
Single Plane A4C EF: 52.2 %
Weight: 3456 oz

## 2022-10-27 LAB — TROPONIN I (HIGH SENSITIVITY)
Troponin I (High Sensitivity): 11563 ng/L (ref <18)
Troponin I (High Sensitivity): 11386 ng/L (ref <18)

## 2022-10-27 LAB — LIPASE, BLOOD: Lipase: 34 U/L (ref 11–51)

## 2022-10-27 LAB — BASIC METABOLIC PANEL
Anion gap: 11 (ref 5–15)
BUN: 14 mg/dL (ref 6–20)
CO2: 24 mmol/L (ref 22–32)
Calcium: 9.7 mg/dL (ref 8.9–10.3)
Chloride: 100 mmol/L (ref 98–111)
Creatinine, Ser: 0.92 mg/dL (ref 0.61–1.24)
GFR, Estimated: >60 mL/min (ref >60)
Glucose, Bld: 111 mg/dL — ABNORMAL HIGH (ref 70–99)
Potassium: 3.8 mmol/L (ref 3.5–5.1)
Sodium: 135 mmol/L (ref 135–145)

## 2022-10-27 LAB — MRSA NEXT GEN BY PCR, NASAL: MRSA by PCR Next Gen: NOT DETECTED

## 2022-10-27 LAB — HEPARIN LEVEL (UNFRACTIONATED): Heparin Unfractionated: 0.15 IU/mL — ABNORMAL LOW (ref 0.30–0.70)

## 2022-10-27 MED ORDER — ASPIRIN 81 MG PO TBEC
81.0000 mg | DELAYED_RELEASE_TABLET | Freq: Every day | ORAL | Status: DC
  Administered 2022-10-28: 81 mg via ORAL
  Filled 2022-10-27: qty 1

## 2022-10-27 MED ORDER — SODIUM CHLORIDE 0.9% FLUSH: 3.0000 mL | INTRAVENOUS | Status: DC | PRN

## 2022-10-27 MED ORDER — ASPIRIN 81 MG PO CHEW: 81.0000 mg | CHEWABLE_TABLET | ORAL | Status: DC

## 2022-10-27 MED ORDER — METOPROLOL TARTRATE 25 MG PO TABS
25.0000 mg | ORAL_TABLET | Freq: Two times a day (BID) | ORAL | Status: DC
  Administered 2022-10-27: 25 mg via ORAL
  Filled 2022-10-27: qty 1

## 2022-10-27 MED ORDER — ATORVASTATIN CALCIUM 80 MG PO TABS
80.0000 mg | ORAL_TABLET | Freq: Every day | ORAL | Status: DC
  Administered 2022-10-27 – 2022-10-30 (×4): 80 mg via ORAL
  Filled 2022-10-27 (×4): qty 1

## 2022-10-27 MED ORDER — HEPARIN BOLUS VIA INFUSION
4000.0000 [IU] | Freq: Once | INTRAVENOUS | Status: AC
  Administered 2022-10-27: 4000 [IU] via INTRAVENOUS
  Filled 2022-10-27: qty 4000

## 2022-10-27 MED ORDER — ONDANSETRON HCL 4 MG/2ML IJ SOLN: 4.0000 mg | Freq: Four times a day (QID) | INTRAMUSCULAR | Status: DC | PRN

## 2022-10-27 MED ORDER — SODIUM CHLORIDE 0.9% FLUSH
3.0000 mL | Freq: Two times a day (BID) | INTRAVENOUS | Status: DC
  Administered 2022-10-28 – 2022-10-29 (×4): 3 mL via INTRAVENOUS

## 2022-10-27 MED ORDER — HEPARIN (PORCINE) 25000 UT/250ML-% IV SOLN
1500.0000 [IU]/h | INTRAVENOUS | Status: DC
  Administered 2022-10-27: 1200 [IU]/h via INTRAVENOUS
  Administered 2022-10-28: 1500 [IU]/h via INTRAVENOUS
  Filled 2022-10-27 (×2): qty 250

## 2022-10-27 MED ORDER — LORATADINE 10 MG PO TABS
10.0000 mg | ORAL_TABLET | Freq: Every day | ORAL | Status: DC
  Administered 2022-10-27 – 2022-10-30 (×4): 10 mg via ORAL
  Filled 2022-10-27 (×4): qty 1

## 2022-10-27 MED ORDER — SODIUM CHLORIDE 0.9 % WEIGHT BASED INFUSION: 3.0000 mL/kg/h | INTRAVENOUS | Status: AC

## 2022-10-27 MED ORDER — NITROGLYCERIN 0.4 MG SL SUBL
0.4000 mg | SUBLINGUAL_TABLET | SUBLINGUAL | Status: DC | PRN
  Administered 2022-10-28 (×3): 0.4 mg via SUBLINGUAL
  Filled 2022-10-27: qty 1

## 2022-10-27 MED ORDER — HEPARIN BOLUS VIA INFUSION
2000.0000 [IU] | Freq: Once | INTRAVENOUS | Status: AC
  Administered 2022-10-27: 2000 [IU] via INTRAVENOUS
  Filled 2022-10-27: qty 2000

## 2022-10-27 MED ORDER — SODIUM CHLORIDE 0.9 % WEIGHT BASED INFUSION: 3.0000 mL/kg/h | INTRAVENOUS | Status: DC

## 2022-10-27 MED ORDER — SODIUM CHLORIDE 0.9 % WEIGHT BASED INFUSION
1.0000 mL/kg/h | INTRAVENOUS | Status: DC
  Administered 2022-10-27 – 2022-10-28 (×2): 1 mL/kg/h via INTRAVENOUS

## 2022-10-27 MED ORDER — SODIUM CHLORIDE 0.9 % WEIGHT BASED INFUSION: 1.0000 mL/kg/h | INTRAVENOUS | Status: DC

## 2022-10-27 MED ORDER — ACETAMINOPHEN 325 MG PO TABS
650.0000 mg | ORAL_TABLET | Freq: Four times a day (QID) | ORAL | Status: DC | PRN
  Administered 2022-10-27 – 2022-10-29 (×5): 650 mg via ORAL
  Filled 2022-10-27 (×5): qty 2

## 2022-10-27 MED ORDER — SODIUM CHLORIDE 0.9 % IV SOLN: 250.0000 mL | INTRAVENOUS | Status: DC | PRN

## 2022-10-27 NOTE — H&P (Addendum)
Cardiology Admission History and Physical   Patient ID: Craig Macias MRN: [id number]; DOB: [birth year]   Admission date: 10/27/2022  PCP:  Patient, No Pcp Per   [facility] Providers Cardiologist: New (Dr. Bjorn Pippin)  Chief Complaint:  chest pain and headache  Patient Profile:   Craig Macias is a 52 y.o. male with a history of fatty liver disease and thrombocytopenia but no known cardiac history prior to this admission who is being seen 10/27/2022 for the evaluation of a late presenting STEMI.  History of Present Illness:   Craig Macias is a 52 year old male with no prior cardiac history. Craig Macias presented to the [facility] ED today for chest pain. Patient states Craig Macias was in his usual state of health until the evening of 10/25/2022 around 4pm. 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 Craig Macias initially thought it may be indigestion because Craig Macias was burping or musculoskeletal in nature because Craig Macias had worked out earlier and had been helping his mom pain. The severe 10/10 chest pain lasted for about 4 hours and the improved to about a 2-3/10 pain that has been persistent since that time. Craig Macias denies any chest pain or shortness of breath prior to this. No orthopnea, PND, or edema. Craig Macias had some palpitations that Craig Macias describes as "heart racing" on 10/25/2022 but no lightheadedness, dizziness, or syncope. Craig Macias also developed at severe 10/10 headache on the evening of 10/25/2022 (after the onset of chest pain) that Craig Macias states feels like a sinus headache and has been persistent since that time. Craig Macias states his whole body has been aching so Craig Macias thought Craig Macias may have the flu. No recent sick contact.  No fevers, cough, nasal congestion, nausea, or vomiting. No abnormal bleeding in urine or stools.  Craig Macias presented to his PCP's office today  for an annual physical. EKG there showed interior ST elevations. Craig Macias was given 324mg  of Aspirin and EMS was called.  Upon arrival to the ED, patient mildly hypertensive but vitals stable. EKG showed normal sinus rhythm with Q waves and mild ST elevation in inferior leads and leads V3-V6. Based on EKG and onset of symptoms, looks like Craig Macias has had a late presenting STEMI.  Initial high-sensitivity troponin elevated at 11,563. Chest x-ray showed no acute findings. WBC 12.2, Hgb 15.9, Plts 91. Na 135, K 3.8, Glucose 111, BUN 14, Cr 0.92. Lipase normal.  Craig Macias has been started on IV Heparin.  At the time of this evaluation, patient reports mild 2-3/10 chest pain. His bigger complaint is a severe headache. Craig Macias states Craig Macias took Ibuprofen for this earlier this morning which did help some. No other neurologic symptoms.  Craig Macias does have a history of tobacco use. Craig Macias states Craig Macias used to smoke socially (maybe once a month) but denies any tobacco use over the last year. Craig Macias does have a significant family history of heart disease on his mother's side. Maternal great grandfather died of a MI at the age of 9, maternal grandfather died of CHF at the age of 6, and there are multiple maternal aunts with heart disease as wells (CHF and CAD).   History reviewed. No pertinent past medical history.  Past Surgical History:  Procedure Laterality Date   VASECTOMY  09/2015     Medications Prior to Admission: Prior to Admission medications  Medication Sig Start Date End Date Taking? Authorizing Provider  fexofenadine (ALLEGRA) 30 MG tablet Take 30 mg by mouth daily.    [provider]     Allergies:    Allergies  Allergen Reactions   Amoxicillin Swelling and Rash   Biaxin [Clarithromycin] Swelling    Social History:   Social History   Socioeconomic History   Marital status: Unknown    Spouse name: Not on file   Number of children: Not on file   Years of education: Not on file   Highest education level: Not on  file  Occupational History   Not on file  Tobacco Use   Smoking status: Some Days   Smokeless tobacco: Former   Tobacco comments:    social  Advertising account planner   Vaping Use: Never used  Substance and Sexual Activity   Alcohol use: Yes    Alcohol/week: 1.0 standard drink of alcohol    Types: 1 Cans of beer per week    Comment: occasional   Drug use: Yes    Types: Marijuana    Comment: occasional   Sexual activity: Yes    Birth control/protection: None  Other Topics Concern   Not on file  Social History Narrative   Not on file   Social Determinants of Health   Financial Resource Strain: Not on file  Food Insecurity: Not on file  Transportation Needs: Not on file  Physical Activity: Not on file  Stress: Not on file  Social Connections: Not on file  Intimate Partner Violence: Not on file    Family History:   The patient's family history includes Cancer in his father; Colitis in his mother; Heart attack in his paternal grandfather; Stroke in his maternal grandfather.    ROS:  Please see the history of present illness.  Review of Systems  Constitutional:  Positive for chills and diaphoresis. Negative for fever.  HENT:  Negative for congestion.   Eyes:  Negative for blurred vision and double vision.  Respiratory:  Positive for shortness of breath. Negative for cough.   Cardiovascular:  Positive for chest pain and palpitations. Negative for orthopnea, leg swelling and PND.  Gastrointestinal:  Negative for blood in stool, melena, nausea and vomiting.  Genitourinary:  Negative for hematuria.  Musculoskeletal:  Positive for back pain and myalgias.  Neurological:  Negative for dizziness and loss of consciousness.  Endo/Heme/Allergies:  Does not bruise/bleed easily.  Psychiatric/Behavioral:  Negative for substance abuse.      Physical Exam/Data:   Vitals:   10/27/22 1215 10/27/22 1230 10/27/22 1315 10/27/22 1415  BP: 132/87 (!) 119/106 126/82 132/87  Pulse: 100 (!) 101 92 100   Resp: 14 (!) 25 (!) 24 17  Temp:      TempSrc:      SpO2: 99% 100% 99% 98%  Weight:      Height:        Intake/Output Summary (Last 24 hours) at 10/27/2022 1447 Last data filed at 10/27/2022 1322 Gross per 24 hour  Intake 70.08 ml  Output --  Net 70.08 ml      10/27/2022   10:22 AM 12/14/2017    8:49 AM 05/05/2017    9:20 AM  Last 3 Weights  Weight (lbs) 216 lb 205 lb 3.2 oz 218 lb  Weight (kg) 97.977 kg 93.078 kg 98.884 kg     Body mass index is 28.5 kg/m.  General: 52 y.o. Caucasian male resting comfortably in no acute distress. HEENT: Normocephalic and atraumatic. Sclera  clear.  Neck: Supple. No JVD. Heart: RRR. Distinct S1 and S2. No murmurs, gallops, or rubs. Radial pulses 2+ and equal bilaterally. Lungs: No increased work of breathing. Clear to ausculation bilaterally. No wheezes, rhonchi, or rales.  Abdomen: Soft, non-distended, and non-tender to palpation. MSK: Normal strength and tone for age. Extremities: No clubbing, cyanosis, or edema.    Skin: Warm and dry. Neuro: Alert and oriented x3. No focal deficits. Psych: Normal affect. Responds appropriately.  EKG:  The ECG that was done was personally reviewed and demonstrates ormal sinus rhythm, rate 96 bpm, with Q waves and mild ST elevation in inferior leads and leads V3-V6  Relevant CV Studies: N/A.  Laboratory Data:  High Sensitivity Troponin:   Recent Labs  Lab 10/27/22 1035 10/27/22 1205  TROPONINIHS 11,563* 11,386*      Chemistry Recent Labs  Lab 10/27/22 1035  NA 135  K 3.8  CL 100  CO2 24  GLUCOSE 111*  BUN 14  CREATININE 0.92  CALCIUM 9.7  GFRNONAA >60  ANIONGAP 11    No results for input(s): "PROT", "ALBUMIN", "AST", "ALT", "ALKPHOS", "BILITOT" in the last 168 hours. Lipids No results for input(s): "CHOL", "TRIG", "HDL", "LABVLDL", "LDLCALC", "CHOLHDL" in the last 168 hours. Hematology Recent Labs  Lab 10/27/22 1035  WBC 12.2*  RBC 4.64  HGB 15.9  HCT 44.5  MCV 95.9  MCH  34.3*  MCHC 35.7  RDW 12.5  PLT 91*   Thyroid No results for input(s): "TSH", "FREET4" in the last 168 hours. BNPNo results for input(s): "BNP", "PROBNP" in the last 168 hours.  DDimer No results for input(s): "DDIMER" in the last 168 hours.   Radiology/Studies:  DG Chest Portable 1 View  Result Date: 10/27/2022 CLINICAL DATA:  Chest pain for 4 days.  Abnormal EKG. EXAM: PORTABLE CHEST 1 VIEW COMPARISON:  04/11/2019. FINDINGS: Cardiac silhouette is normal in size and configuration. Normal mediastinal and hilar contours. Clear lungs.  No pleural effusion or pneumothorax. Skeletal structures are grossly intact. IMPRESSION: No active disease. Electronically Signed   By: Amie Portland M.D.   On: 10/27/2022 10:38     Assessment and Plan:   Late Presenting STEMI Patient developed sudden onset of chest pain on evening of 10/25/2022 at 4pm and had severe 10/10 chest burning with associated back, neck, and bilateral arm pain as well as associated shortness of breath and diaphoresis/ chills for 4 hours. Pain then subsided but has been persistent at a 2-3/10 on the pain scale since then. Craig Macias presented to his PCP's office today for annual physical and EKG showed ST elevations so Craig Macias was transported to the ED via EMS. EKG here shows normal sinus rhythm with Q waves and mild ST elevation in inferior leads and leads V3-V6. High-sensitivity troponin 11, 563 >> 11,386. Based on timing of symptoms and EKG and troponin trend, sounds like Craig Macias had a MI on the evening of 10/25/2022.  - Patient currently as 2-3/10 chest pain.  - Continue IV Heparin. - Craig Macias already received Aspirin 324mg  in the field.  - Will Lipitor 80mg  daily. - Will check lipid panel and hemoglobin A1c.  - Will order Echo. - Will need cardiac catheterization. The patient understands that risks include but are not limited to stroke (1 in 1000), death (1 in 1000), kidney failure [usually temporary] (1 in 500), bleeding (1 in 200), allergic reaction  [possibly serious] (1 in 200), and agrees to proceed.   Headache Patient reports a 10/10 headache since the evening of  10/25/2022. Craig Macias states it feels like a sinus headache. No other neurologic symptoms. - OK to give Tylenol. - Patient has already been started on IV Heparin but will get STAT head CT to rule prior to taken patient to cath labs.  Chronic Thrombocytopenia Per chart review, patient has a history of chronic thrombocytopenia with baseline platelets in the 90s to low 100s. Platelets stable at 91,000 on arrival. - Continue to monitor.  Leukocytosis WBC 12.2 on admission. Chest x-ray showed no acute findings. - No obvious signs of infection. - Suspect reactive leukocytosis in setting of MI. Continue to monitor.  Code Status: Full Code (personally discussed with patient)   Risk Assessment/Risk Scores:   TIMI Risk Score for ST  Elevation MI:   The patient's TIMI risk score is 4, which indicates a 7.3% risk of all cause mortality at 30 days.{   Severity of Illness: The appropriate patient status for this patient is INPATIENT. Inpatient status is judged to be reasonable and necessary in order to provide the required intensity of service to ensure the patient's safety. The patient's presenting symptoms, physical exam findings, and initial radiographic and laboratory data in the context of their chronic comorbidities is felt to place them at high risk for further clinical deterioration. Furthermore, it is not anticipated that the patient will be medically stable for discharge from the hospital within 2 midnights of admission.   * I certify that at the point of admission it is my clinical judgment that the patient will require inpatient hospital care spanning beyond 2 midnights from the point of admission due to high intensity of service, high risk for further deterioration and high frequency of surveillance required.*   For questions or updates, please contact [facility] Please consult [email or web address] for contact info under     Signed, Corrin Parker, PA-C  10/27/2022 2:47 PM   Patient seen and examined.  Agree with above documentation.  Craig Macias is a 52 year old male with a history of fatty liver disease, thrombocytopenia who presents today with chest pain.  Craig Macias has no known cardiac history.  Reports that on 5/12 Craig Macias had onset of chest pain.  Reports 10 out of 10 chest pain that Craig Macias described as burning that went across his chest.  Reports pain lasted for about 4 hours and then improved.  However Craig Macias states pain has not subsided since that time, currently with mild pain.  Craig Macias had an annual physical with his PCP today and reported his recent chest pain.  EKG was checked and showed ST elevations in leads II, III, aVF, V3-6.  EMS was called and Craig Macias was transported to ED.  Labs notable for troponin 16109 > 11386, creatinine 0.9, WBC 12.2, hemoglobin 15.9, platelets 91.  Labs notable for BP 138/96, pulse 89, SpO2 100% on room air.  On exam, patient is alert and oriented, tachycardic, regular rhythm, no murmurs, lungs CTAB, no LE edema or JVD.  Presentation is concerning for late presenting STEMI.  Suspect likely had infarct on 5/12 when having chest pain on 5/12.  Recommend LHC.  Risks and benefits of cardiac catheterization have been discussed with the patient.  These include bleeding, infection, kidney damage, stroke, heart attack, death.  The patient understands these risks and is willing to proceed.  Craig Macias is also reporting 10/10 headache since 5/12.  Will check stat head CT before proceeding with cath.  Little Ishikawa, MD

## 2022-10-27 NOTE — Progress Notes (Signed)
  Echocardiogram 2D Echocardiogram has been performed.  Craig Macias 10/27/2022, 5:49 PM

## 2022-10-27 NOTE — ED Triage Notes (Signed)
Pt arrives via EMS from primary doctor's office, Pt began having heartburn 4pm yesterday, thought the pain was indigestion. Went to see his doctor today for yearly physical, EKG showed elevation in inferior leads and well as diffuse elevation in other leads. Pt has 20g PIV in LAC, given 324mg  ASA. Currenty pain free. VSS. HR 70, 130/70, 98%.

## 2022-10-27 NOTE — Progress Notes (Signed)
ANTICOAGULATION CONSULT NOTE   Pharmacy Consult for Heparin Indication: chest pain/ACS  Allergies  Allergen Reactions   Amoxil [Amoxicillin] Swelling and Rash   Biaxin [Clarithromycin] Swelling   Penicillins Swelling and Rash    Patient Measurements: Height: 6\' 1"  (185.4 cm) Weight: 98 kg (216 lb) IBW/kg (Calculated) : 79.9 HEPARIN DW (KG): 98   Vital Signs: Temp: 98.5 F (36.9 C) (05/14 1840) Temp Source: Oral (05/14 1840) BP: 120/74 (05/14 1840) Pulse Rate: 112 (05/14 1840)  Assessment: 51 YOM presenting with CP starting 5/12, with concern for STEMI. Pharmacy has been consulted to dose heparin. Patient is not on anticoagulants PTA.   -heparin level 0.15 on heparin 1200 units/hr   Goal of Therapy:  Heparin level 0.3-0.7 units/ml Monitor platelets by anticoagulation protocol: Yes   Plan:  -Heparin bolus 2000 units IV then increase infusion to 1500 units/hr -Heparin level and CBC in am  Harland German, PharmD Clinical Pharmacist **Pharmacist phone directory can now be found on [email or web address] ([email or web address]).  Listed under [facility].

## 2022-10-27 NOTE — Progress Notes (Signed)
ANTICOAGULATION CONSULT NOTE - Initial Consult  Pharmacy Consult for Heparin Indication: chest pain/ACS  Allergies  Allergen Reactions   Amoxicillin Swelling and Rash   Biaxin [Clarithromycin] Swelling    Patient Measurements: Height: 6\' 1"  (185.4 cm) Weight: 98 kg (216 lb) IBW/kg (Calculated) : 79.9 Heparin Dosing Weight: 98 kg   Vital Signs: Temp: 98.8 F (37.1 C) (05/14 1025) Temp Source: Oral (05/14 1025) BP: 138/96 (05/14 1025) Pulse Rate: 89 (05/14 1025)  Assessment: 51 YOM presenting with CP starting 5/12, with concern for STEMI. Pharmacy has been consulted to dose heparin. Patient is not on anticoagulants PTA.   Goal of Therapy:  Heparin level 0.3-0.7 units/ml Monitor platelets by anticoagulation protocol: Yes   Plan:  Give 4,000 units bolus x 1 Start heparin at 1,200 units/hour  Obtain 8-hour heparin level  Check heparin level and CBC daily   Jani Gravel, PharmD PGY-2 Infectious Diseases Resident  10/27/2022 10:39 AM

## 2022-10-27 NOTE — ED Notes (Signed)
Cardiology at bedside.

## 2022-10-27 NOTE — ED Provider Notes (Signed)
[place] EMERGENCY DEPARTMENT AT [facility] Provider Note   CSN: [id number] Arrival date & time: 10/27/22  1008     History Chief Complaint  Patient presents with   Chest Pain    HPI Craig Macias is a 52 y.o. male presenting for chest pain since Sunday.  I was emergently called to bedside for evaluation.  He is a 52 year old male with no known medical problems not on any daily medication who had sudden onset chest pain at 4 PM 40 hours prior to arrival.  It was severe in nature and retrosternal.  Radiated to his back.  Took yesterday off work because of malaise fatigue follow-up with PCP today had a concerning EKG brought in by EMS.    Patient's recorded medical, surgical, social, medication list and allergies were reviewed in the Snapshot window as part of the initial history.   Review of Systems   Review of Systems  Constitutional:  Negative for chills and fever.  HENT:  Negative for ear pain and sore throat.   Eyes:  Negative for pain and visual disturbance.  Respiratory:  Positive for chest tightness. Negative for cough and shortness of breath.   Cardiovascular:  Positive for chest pain. Negative for palpitations.  Gastrointestinal:  Negative for abdominal pain and vomiting.  Genitourinary:  Negative for dysuria and hematuria.  Musculoskeletal:  Negative for arthralgias and back pain.  Skin:  Negative for color change and rash.  Neurological:  Negative for seizures and syncope.  All other systems reviewed and are negative.   Physical Exam Updated Vital Signs BP 132/87   Pulse 100   Temp 98.8 F (37.1 C) (Oral)   Resp 17   Ht 6\' 1"  (1.854 m)   Wt 98 kg   SpO2 98%   BMI 28.50 kg/m  Physical Exam Vitals and nursing note reviewed.  Constitutional:      General: He is not in acute distress.    Appearance: He is well-developed.  HENT:     Head: Normocephalic and atraumatic.  Eyes:     Conjunctiva/sclera: Conjunctivae normal.  Cardiovascular:      Rate and Rhythm: Normal rate and regular rhythm.     Heart sounds: No murmur heard. Pulmonary:     Effort: Pulmonary effort is normal. No respiratory distress.     Breath sounds: Normal breath sounds.  Abdominal:     Palpations: Abdomen is soft.     Tenderness: There is no abdominal tenderness.  Musculoskeletal:        General: No swelling.     Cervical back: Neck supple.  Skin:    General: Skin is warm and dry.     Capillary Refill: Capillary refill takes less than 2 seconds.  Neurological:     Mental Status: He is alert.  Psychiatric:        Mood and Affect: Mood normal.      ED Course/ Medical Decision Making/ A&P    Procedures .Critical Care  Performed by: Glyn Ade, MD Authorized by: Glyn Ade, MD   Critical care provider statement:    Critical care time (minutes):  95   Critical care was necessary to treat or prevent imminent or life-threatening deterioration of the following conditions:  Cardiac failure   Critical care was time spent personally by me on the following activities:  Development of treatment plan with patient or surrogate, discussions with consultants, evaluation of patient's response to treatment, examination of patient, ordering and review of laboratory studies, ordering  and review of radiographic studies, ordering and performing treatments and interventions, pulse oximetry, re-evaluation of patient's condition and review of old charts   Care discussed with: admitting provider      Medications Ordered in ED Medications  heparin ADULT infusion 100 units/mL (25000 units/251mL) (1,200 Units/hr Intravenous Infusion Verify 10/27/22 1322)  acetaminophen (TYLENOL) tablet 650 mg (has no administration in time range)  metoprolol tartrate (LOPRESSOR) tablet 25 mg (has no administration in time range)  heparin bolus via infusion 4,000 Units (4,000 Units Intravenous Bolus from Bag 10/27/22 1044)    Medical Decision Making:    Craig Macias is a  52 y.o. male who presented to the ED today with severe chest pain detailed above.     Handoff received from EMS.  Patient placed on continuous vitals and telemetry monitoring while in ED which was reviewed periodically.   Complete initial physical exam performed, notably the patient  was HDS in NAD. Cardiology was at bedside on his arrival and have concern for an ischemic EKG though not meeting criteria for ST elevations. They recommended medical management immediately on his arrival.   Reviewed and confirmed nursing documentation for past medical history, family history, social history.    Initial Assessment:   This is a critically ill patient presenting with an ischemic EKG.  EMS has already treated patient with aspirin, cardiology has already evaluated at bedside.  They recommended medical management with heparinization, further evaluation.  Lab work ordered.  Differential was broadened to include other etiologies patient's symptoms such as pneumonia, i infection, critical anemia.  Given his URI symptoms yesterday, myocarditis is on the differential as well.  Troponins ordered screening labs ordered.  Patient started on heparin per cardiology recommendations and will be closely monitored in the emergency room for stability pending admission.  Disposition:   Based on the above findings, I believe this patient is stable for admission.    Patient/family educated about specific findings on our evaluation and explained exact reasons for admission.  Patient/family educated about clinical situation and time was allowed to answer questions.   Admission team communicated with and agreed with need for admission. Patient admitted. Patient ready to move at this time.     Emergency Department Medication Summary:   Medications  heparin ADULT infusion 100 units/mL (25000 units/2104mL) (1,200 Units/hr Intravenous Infusion Verify 10/27/22 1322)  acetaminophen (TYLENOL) tablet 650 mg (has no administration in  time range)  metoprolol tartrate (LOPRESSOR) tablet 25 mg (has no administration in time range)  heparin bolus via infusion 4,000 Units (4,000 Units Intravenous Bolus from Bag 10/27/22 1044)     Clinical Impression:  1. NSTEMI (non-ST elevated myocardial infarction) (HCC)      Admit   Final Clinical Impression(s) / ED Diagnoses Final diagnoses:  NSTEMI (non-ST elevated myocardial infarction) [facility])    Rx / DC Orders ED Discharge Orders     None         Glyn Ade, MD 10/27/22 1457

## 2022-10-27 NOTE — ED Notes (Signed)
11, 563 troponin level, MD made aware.

## 2022-10-28 ENCOUNTER — Telehealth (HOSPITAL_COMMUNITY): Payer: Self-pay | Admitting: Pharmacy Technician

## 2022-10-28 ENCOUNTER — Encounter (HOSPITAL_COMMUNITY)
Admission: EM | Disposition: A | Discharge status: Home / Self Care | Payer: Self-pay | Source: Home / Self Care | Attending: Cardiology

## 2022-10-28 ENCOUNTER — Other Ambulatory Visit (HOSPITAL_COMMUNITY): Payer: Self-pay

## 2022-10-28 DIAGNOSIS — R519 Headache, unspecified: Secondary | ICD-10-CM | POA: Diagnosis not present

## 2022-10-28 DIAGNOSIS — D696 Thrombocytopenia, unspecified: Secondary | ICD-10-CM | POA: Diagnosis not present

## 2022-10-28 DIAGNOSIS — I2111 ST elevation (STEMI) myocardial infarction involving right coronary artery: Secondary | ICD-10-CM | POA: Diagnosis not present

## 2022-10-28 DIAGNOSIS — I2510 Atherosclerotic heart disease of native coronary artery without angina pectoris: Secondary | ICD-10-CM | POA: Diagnosis not present

## 2022-10-28 HISTORY — PX: LEFT HEART CATH AND CORONARY ANGIOGRAPHY: CATH118249

## 2022-10-28 HISTORY — PX: CORONARY/GRAFT ACUTE MI REVASCULARIZATION: CATH118305

## 2022-10-28 LAB — COMPREHENSIVE METABOLIC PANEL
ALT: 83 U/L — ABNORMAL HIGH (ref 0–44)
AST: 74 U/L — ABNORMAL HIGH (ref 15–41)
Albumin: 3.6 g/dL (ref 3.5–5.0)
Alkaline Phosphatase: 52 U/L (ref 38–126)
Anion gap: 10 (ref 5–15)
BUN: 12 mg/dL (ref 6–20)
CO2: 22 mmol/L (ref 22–32)
Calcium: 8.7 mg/dL — ABNORMAL LOW (ref 8.9–10.3)
Chloride: 103 mmol/L (ref 98–111)
Creatinine, Ser: 0.85 mg/dL (ref 0.61–1.24)
GFR, Estimated: >60 mL/min (ref >60)
Glucose, Bld: 103 mg/dL — ABNORMAL HIGH (ref 70–99)
Potassium: 4 mmol/L (ref 3.5–5.1)
Sodium: 135 mmol/L (ref 135–145)
Total Bilirubin: 2.3 mg/dL — ABNORMAL HIGH (ref 0.3–1.2)
Total Protein: 6.4 g/dL — ABNORMAL LOW (ref 6.5–8.1)

## 2022-10-28 LAB — LIPID PANEL
Cholesterol: 148 mg/dL (ref 0–200)
HDL: 58 mg/dL (ref >40)
LDL Cholesterol: 80 mg/dL (ref 0–99)
Total CHOL/HDL Ratio: 2.6 RATIO
Triglycerides: 49 mg/dL (ref <150)
VLDL: 10 mg/dL (ref 0–40)

## 2022-10-28 LAB — CBC
HCT: 41.7 % (ref 39.0–52.0)
Hemoglobin: 15.1 g/dL (ref 13.0–17.0)
MCH: 34.5 pg — ABNORMAL HIGH (ref 26.0–34.0)
MCHC: 36.2 g/dL — ABNORMAL HIGH (ref 30.0–36.0)
MCV: 95.2 fL (ref 80.0–100.0)
Platelets: 87 10*3/uL — ABNORMAL LOW (ref 150–400)
RBC: 4.38 MIL/uL (ref 4.22–5.81)
RDW: 12.4 % (ref 11.5–15.5)
WBC: 12 10*3/uL — ABNORMAL HIGH (ref 4.0–10.5)
nRBC: 0 % (ref 0.0–0.2)

## 2022-10-28 LAB — POCT ACTIVATED CLOTTING TIME: Activated Clotting Time: 314 seconds

## 2022-10-28 LAB — HEMOGLOBIN A1C
Hgb A1c MFr Bld: 4.8 % (ref 4.8–5.6)
Mean Plasma Glucose: 91.06 mg/dL

## 2022-10-28 LAB — LIPOPROTEIN A (LPA): Lipoprotein (a): 75.2 nmol/L — ABNORMAL HIGH (ref <75.0)

## 2022-10-28 SURGERY — LEFT HEART CATH AND CORONARY ANGIOGRAPHY
Anesthesia: LOCAL

## 2022-10-28 MED ORDER — IOHEXOL 350 MG/ML SOLN
INTRAVENOUS | Status: DC | PRN
  Administered 2022-10-28: 125 mL

## 2022-10-28 MED ORDER — VERAPAMIL HCL 2.5 MG/ML IV SOLN
INTRAVENOUS | Status: DC | PRN
  Administered 2022-10-28: 10 mL via INTRA_ARTERIAL

## 2022-10-28 MED ORDER — TIROFIBAN HCL IN NACL 5-0.9 MG/100ML-% IV SOLN
INTRAVENOUS | Status: DC | PRN
  Administered 2022-10-28: .15 ug/kg/min via INTRAVENOUS

## 2022-10-28 MED ORDER — SODIUM CHLORIDE 0.9% FLUSH: 3.0000 mL | INTRAVENOUS | Status: DC | PRN

## 2022-10-28 MED ORDER — METOPROLOL TARTRATE 12.5 MG HALF TABLET
12.5000 mg | ORAL_TABLET | Freq: Two times a day (BID) | ORAL | Status: DC
  Administered 2022-10-28 (×2): 12.5 mg via ORAL
  Filled 2022-10-28 (×2): qty 1

## 2022-10-28 MED ORDER — MIDAZOLAM HCL 2 MG/2ML IJ SOLN
INTRAMUSCULAR | Status: DC | PRN
  Administered 2022-10-28 (×2): 1 mg via INTRAVENOUS

## 2022-10-28 MED ORDER — FENTANYL CITRATE (PF) 100 MCG/2ML IJ SOLN
INTRAMUSCULAR | Status: AC
  Filled 2022-10-28: qty 2

## 2022-10-28 MED ORDER — SODIUM CHLORIDE 0.9% FLUSH
3.0000 mL | Freq: Two times a day (BID) | INTRAVENOUS | Status: DC
  Administered 2022-10-28 – 2022-10-30 (×5): 3 mL via INTRAVENOUS

## 2022-10-28 MED ORDER — MIDAZOLAM HCL 2 MG/2ML IJ SOLN
INTRAMUSCULAR | Status: AC
  Filled 2022-10-28: qty 2

## 2022-10-28 MED ORDER — TIROFIBAN (AGGRASTAT) BOLUS VIA INFUSION
INTRAVENOUS | Status: DC | PRN
  Administered 2022-10-28: 2450 ug via INTRAVENOUS

## 2022-10-28 MED ORDER — CHLORHEXIDINE GLUCONATE CLOTH 2 % EX PADS
6.0000 | MEDICATED_PAD | Freq: Every day | CUTANEOUS | Status: DC
  Administered 2022-10-28 – 2022-10-30 (×3): 6 via TOPICAL

## 2022-10-28 MED ORDER — HEPARIN SODIUM (PORCINE) 1000 UNIT/ML IJ SOLN
INTRAMUSCULAR | Status: DC | PRN
  Administered 2022-10-28: 8000 [IU] via INTRAVENOUS

## 2022-10-28 MED ORDER — HEPARIN (PORCINE) IN NACL 1000-0.9 UT/500ML-% IV SOLN
INTRAVENOUS | Status: DC | PRN
  Administered 2022-10-28 (×2): 500 mL

## 2022-10-28 MED ORDER — SODIUM CHLORIDE 0.9 % IV SOLN: 250.0000 mL | INTRAVENOUS | Status: DC | PRN

## 2022-10-28 MED ORDER — ASPIRIN 81 MG PO TBEC
81.0000 mg | DELAYED_RELEASE_TABLET | Freq: Every day | ORAL | Status: DC
  Administered 2022-10-29 – 2022-10-30 (×2): 81 mg via ORAL
  Filled 2022-10-28 (×2): qty 1

## 2022-10-28 MED ORDER — SODIUM CHLORIDE 0.9 % IV SOLN: INTRAVENOUS | Status: AC

## 2022-10-28 MED ORDER — ASPIRIN 325 MG PO TABS
ORAL_TABLET | ORAL | Status: DC | PRN
  Administered 2022-10-28: 325 mg via ORAL

## 2022-10-28 MED ORDER — FENTANYL CITRATE (PF) 100 MCG/2ML IJ SOLN
INTRAMUSCULAR | Status: DC | PRN
  Administered 2022-10-28 (×2): 25 ug via INTRAVENOUS

## 2022-10-28 MED ORDER — TIROFIBAN HCL IN NACL 5-0.9 MG/100ML-% IV SOLN
INTRAVENOUS | Status: AC
  Filled 2022-10-28: qty 100

## 2022-10-28 MED ORDER — ASPIRIN 325 MG PO TABS
ORAL_TABLET | ORAL | Status: AC
  Filled 2022-10-28: qty 1

## 2022-10-28 MED ORDER — TICAGRELOR 90 MG PO TABS
ORAL_TABLET | ORAL | Status: DC | PRN
  Administered 2022-10-28: 180 mg via ORAL

## 2022-10-28 MED ORDER — HEPARIN SODIUM (PORCINE) 1000 UNIT/ML IJ SOLN
INTRAMUSCULAR | Status: AC
  Filled 2022-10-28: qty 10

## 2022-10-28 MED ORDER — TICAGRELOR 90 MG PO TABS
ORAL_TABLET | ORAL | Status: AC
  Filled 2022-10-28: qty 1

## 2022-10-28 MED ORDER — TICAGRELOR 90 MG PO TABS
90.0000 mg | ORAL_TABLET | Freq: Two times a day (BID) | ORAL | Status: DC
  Administered 2022-10-28 – 2022-10-30 (×4): 90 mg via ORAL
  Filled 2022-10-28 (×4): qty 1

## 2022-10-28 MED ORDER — LIDOCAINE HCL (PF) 1 % IJ SOLN
INTRAMUSCULAR | Status: AC
  Filled 2022-10-28: qty 30

## 2022-10-28 MED ORDER — HYDRALAZINE HCL 20 MG/ML IJ SOLN: 10.0000 mg | INTRAMUSCULAR | Status: AC | PRN

## 2022-10-28 MED ORDER — SODIUM CHLORIDE 0.9 % IV SOLN
INTRAVENOUS | Status: AC | PRN
  Administered 2022-10-28: 999 mL/h via INTRAVENOUS

## 2022-10-28 MED ORDER — LIDOCAINE HCL (PF) 1 % IJ SOLN
INTRAMUSCULAR | Status: DC | PRN
  Administered 2022-10-28: 2 mL

## 2022-10-28 MED ORDER — LABETALOL HCL 5 MG/ML IV SOLN: 10.0000 mg | INTRAVENOUS | Status: AC | PRN

## 2022-10-28 SURGICAL SUPPLY — 21 items
BALLN EMERGE MR 2.5X12 (BALLOONS) ×1
BALLN ~~LOC~~ EMERGE MR 2.75X20 (BALLOONS) ×1
BALLOON EMERGE MR 2.5X12 (BALLOONS) IMPLANT
BALLOON ~~LOC~~ EMERGE MR 2.75X20 (BALLOONS) IMPLANT
CATH LAUNCHER 6FR JR4 (CATHETERS) IMPLANT
CATH OPTITORQUE TIG 4.0 5F (CATHETERS) IMPLANT
DEVICE RAD COMP TR BAND LRG (VASCULAR PRODUCTS) IMPLANT
ELECT DEFIB PAD ADLT CADENCE (PAD) IMPLANT
GLIDESHEATH SLEND SS 6F .021 (SHEATH) IMPLANT
GUIDEWIRE INQWIRE 1.5J.035X260 (WIRE) IMPLANT
INQWIRE 1.5J .035X260CM (WIRE) ×1
KIT ENCORE 26 ADVANTAGE (KITS) IMPLANT
KIT HEART LEFT (KITS) ×1 IMPLANT
PACK CARDIAC CATHETERIZATION (CUSTOM PROCEDURE TRAY) ×1 IMPLANT
SHEATH PROBE COVER 6X72 (BAG) IMPLANT
STENT SYNERGY XD 2.50X24 (Permanent Stent) IMPLANT
SYNERGY XD 2.50X24 (Permanent Stent) ×1 IMPLANT
TRANSDUCER W/STOPCOCK (MISCELLANEOUS) ×1 IMPLANT
TUBING CIL FLEX 10 FLL-RA (TUBING) ×1 IMPLANT
WIRE ASAHI PROWATER 180CM (WIRE) IMPLANT
WIRE RUNTHROUGH IZANAI 014 180 (WIRE) IMPLANT

## 2022-10-28 NOTE — Progress Notes (Signed)
Called to see patient acutely. Developed worsening chest pain at 6 am. Unrelieved with 3 sl Ntg. Became hypotensive with BP 89/50. Appears diaphoretic. Ecg shows worsening ST elevation laterally with inferior Q waves.   Code STEMI activated/ Dr Herbie Baltimore aware  Tori Cupps Sw[place]il[place] MD, [facility] 10/28/2022 7:15 AM

## 2022-10-28 NOTE — Progress Notes (Signed)
Rounding Note    Patient Name: Craig Macias Date of Encounter: 10/28/2022  [facility] Cardiologist: None   Subjective   Denies any chest pain or dyspnea.  Inpatient Medications    Scheduled Meds:  [MAR Hold] aspirin EC  81 mg Oral Daily   [MAR Hold] atorvastatin  80 mg Oral Daily   [MAR Hold] Chlorhexidine Gluconate Cloth  6 each Topical Daily   [MAR Hold] loratadine  10 mg Oral Daily   [MAR Hold] sodium chloride flush  3 mL Intravenous Q12H   ticagrelor  90 mg Oral BID   Continuous Infusions:  sodium chloride     sodium chloride 1 mL/kg/hr (10/28/22 0600)   heparin 1,500 Units/hr (10/28/22 0600)   PRN Meds: sodium chloride, [MAR Hold] acetaminophen, [MAR Hold] nitroGLYCERIN, [MAR Hold] ondansetron (ZOFRAN) IV, sodium chloride flush   Vital Signs    Vitals:   10/28/22 0400 10/28/22 0500 10/28/22 0600 10/28/22 0733  BP: 115/79 122/88 113/88   Pulse: 89 97 98   Resp: (!) 24 19 17    Temp: 99.4 F (37.4 C)     TempSrc: Oral     SpO2: 96% 96% 96% 98%  Weight:      Height:        Intake/Output Summary (Last 24 hours) at 10/28/2022 0902 Last data filed at 10/28/2022 0600 Gross per 24 hour  Intake 1180.01 ml  Output 2600 ml  Net -1419.99 ml      10/27/2022    7:06 PM 10/27/2022   10:22 AM 12/14/2017    8:49 AM  Last 3 Weights  Weight (lbs) 214 lb 1.1 oz 216 lb 205 lb 3.2 oz  Weight (kg) 97.1 kg 97.977 kg 93.078 kg      Telemetry    NSR rate 90-100s - Personally Reviewed  ECG    Inferior ST elevations and V3-6, NSR, rate 83 - Personally Reviewed  Physical Exam   GEN: No acute distress.   Neck: No JVD Cardiac: RRR, no murmurs, rubs, or gallops.  Respiratory: Clear to auscultation bilaterally. GI: Soft, nontender, non-distended  MS: No edema; No deformity. Neuro:  Nonfocal  Psych: Normal affect   Labs    High Sensitivity Troponin:   Recent Labs  Lab 10/27/22 1035 10/27/22 1205  TROPONINIHS 11,563* 11,386*     Chemistry Recent  Labs  Lab 10/27/22 1035  NA 135  K 3.8  CL 100  CO2 24  GLUCOSE 111*  BUN 14  CREATININE 0.92  CALCIUM 9.7  GFRNONAA >60  ANIONGAP 11    Lipids No results for input(s): "CHOL", "TRIG", "HDL", "LABVLDL", "LDLCALC", "CHOLHDL" in the last 168 hours.  Hematology Recent Labs  Lab 10/27/22 1035  WBC 12.2*  RBC 4.64  HGB 15.9  HCT 44.5  MCV 95.9  MCH 34.3*  MCHC 35.7  RDW 12.5  PLT 91*   Thyroid No results for input(s): "TSH", "FREET4" in the last 168 hours.  BNPNo results for input(s): "BNP", "PROBNP" in the last 168 hours.  DDimer No results for input(s): "DDIMER" in the last 168 hours.   Radiology    CARDIAC CATHETERIZATION  Result Date: 10/28/2022   Prox RCA to Mid RCA lesion is 35% stenosed.->  Diffuse disease   RPDA-1 lesion is 40% stenosed.   Culprit lesion: RPDA-2 lesion is 50% stenosed. RPDA-3 lesion is 100% stenosed. RPDA-1 lesion is 50% stenosed.   A drug-eluting stent was successfully placed covering both lesions, using a SYNERGY XD 2.50X24.  Taper postdilation  from 2.8 to 2.6 mm. Post intervention, there is a 0% residual stenosis.  TIMI 0 flow pre-, TIMI-3 flow post   ------------------------------   Ramus lesion is 50% stenosed.   1st Diag lesion is 40% stenosed.   RPDA-2 lesion is 50% stenosed.   --------------------------------   There is mild left ventricular systolic dysfunction.  The left ventricular ejection fraction is 45-50% by visual estimate.   LV end diastolic pressure is normal. 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 Normal LVEDP RECOMMENDATIONS Continue with standard post MI care-TR band removal per protocol Check 2D echo As blood  pressure tolerates, titrate GDMT for CAD Anticipating potentially be FasTRACT discharge on 10/29/2022 [name], MD  ECHOCARDIOGRAM COMPLETE  Result Date: 10/27/2022    ECHOCARDIOGRAM REPORT   Patient Name:   Craig Macias Date of Exam: 10/27/2022 Medical Rec #:  [id number]     Height:       73.0 in Accession #:    [id number]    Weight:       216.0 lb Date of Birth:  10-16-1970    BSA:          2.223 m Patient Age:    52 years      BP:           138/96 mmHg Patient Gender: M             HR:           85 bpm. Exam Location:  Inpatient Procedure: 2D Echo, Cardiac Doppler and Color Doppler Indications:   NSTEMI  History:       Patient has no prior history of Echocardiogram examinations.                NSTEMI.  Sonographer:   Lucy Antigua Referring      [id number] Corrin Parker Phys: IMPRESSIONS  1. Left ventricular ejection fraction, by estimation, is 50 to 55%. The left ventricle has low normal function. The left ventricle has no regional wall motion abnormalities. Left ventricular diastolic parameters are consistent with Grade I diastolic dysfunction (impaired relaxation).  2. Right ventricular systolic function is normal. The right ventricular size is normal.  3. The mitral valve is normal in structure. No evidence of mitral valve regurgitation. No evidence of mitral stenosis.  4. The aortic valve is normal in structure. Aortic valve regurgitation is not visualized. No aortic stenosis is present.  5. There is borderline dilatation of the aortic root, measuring 37 mm.  6. The inferior vena cava is normal in size with greater than 50% respiratory variability, suggesting right atrial pressure of 3 mmHg. FINDINGS  Left Ventricle: Left ventricular ejection fraction, by estimation, is 50 to 55%. The left ventricle has low normal function. The left ventricle has no regional wall motion abnormalities. The left ventricular internal cavity size was normal in size. There is no left ventricular hypertrophy. Left  ventricular diastolic parameters are consistent with Grade I diastolic dysfunction (impaired relaxation). Right Ventricle: The right ventricular size is normal. No increase in right ventricular wall thickness. Right ventricular systolic function is normal. Left Atrium: Left atrial size was normal in size. Right Atrium: Right atrial size was normal  in size. Pericardium: There is no evidence of pericardial effusion. Presence of epicardial fat layer. Mitral Valve: The mitral valve is normal in structure. No evidence of mitral valve regurgitation. No evidence of mitral valve stenosis. Tricuspid Valve: The tricuspid valve is normal in structure. Tricuspid valve regurgitation is mild . No evidence of tricuspid stenosis. Aortic Valve: The aortic valve is normal in structure. Aortic valve regurgitation is not visualized. No aortic stenosis is present. Aortic valve mean gradient measures 3.0 mmHg. Aortic valve peak gradient measures 5.3 mmHg. Aortic valve area, by VTI measures 3.94 cm. Pulmonic Valve: The pulmonic valve was normal in structure. Pulmonic valve regurgitation is not visualized. No evidence of pulmonic stenosis. Aorta: There is borderline dilatation of the aortic root, measuring 37 mm. Venous: The inferior vena cava is normal in size with greater than 50% respiratory variability, suggesting right atrial pressure of 3 mmHg. IAS/Shunts: No atrial level shunt detected by color flow Doppler.  LEFT VENTRICLE PLAX 2D LVIDd:         4.15 cm      Diastology LVIDs:         2.75 cm      LV e' medial:    5.22 cm/s LV PW:         1.05 cm      LV E/e' medial:  8.2 LV IVS:        0.95 cm      LV e' lateral:   8.59 cm/s LVOT diam:     2.30 cm      LV E/e' lateral: 5.0 LV SV:         84 LV SV Index:   38 LVOT Area:     4.15 cm  LV Volumes (MOD) LV vol d, MOD A2C: 94.8 ml LV vol d, MOD A4C: 116.0 ml LV vol s, MOD A2C: 43.1 ml LV vol s, MOD A4C: 55.5 ml LV SV MOD A2C:     51.7 ml LV SV MOD A4C:     116.0 ml LV SV MOD BP:       56.5 ml RIGHT VENTRICLE RV S prime:     10.10 cm/s TAPSE (M-mode): 2.6 cm LEFT ATRIUM             Index        RIGHT ATRIUM           Index LA Vol (A2C):   66.9 ml 30.09 ml/m  RA Area:     16.60 cm LA Vol (A4C):   25.0 ml 11.25 ml/m  RA Volume:   43.90 ml  19.75 ml/m LA Biplane Vol: 45.1 ml 20.29 ml/m  AORTIC VALVE AV Area (Vmax):    3.94 cm AV Area (Vmean):   3.70 cm AV Area (VTI):     3.94 cm AV Vmax:           115.00 cm/s AV Vmean:          83.200 cm/s AV VTI:            0.212 m AV Peak Grad:      5.3 mmHg AV Mean Grad:      3.0 mmHg LVOT Vmax:         109.00 cm/s LVOT Vmean:        74.100 cm/s LVOT VTI:          0.201 m LVOT/AV VTI ratio: 0.95  AORTA Ao Root diam: 3.65 cm Ao Asc diam:  3.30 cm MITRAL VALVE MV Area (PHT):  2.59 cm    SHUNTS MV E velocity: 42.80 cm/s  Systemic VTI:  0.20 m MV A velocity: 65.60 cm/s  Systemic Diam: 2.30 cm MV E/A ratio:  0.65 Kardie Tobb DO Electronically signed by Thomasene Ripple DO Signature Date/Time: 10/27/2022/8:13:49 PM    Final    CT HEAD WO CONTRAST ( )  Result Date: 10/27/2022 CLINICAL DATA:  Headache EXAM: CT HEAD WITHOUT CONTRAST TECHNIQUE: Contiguous axial images were obtained from the base of the skull through the vertex without intravenous contrast. RADIATION DOSE REDUCTION: This exam was performed according to the departmental dose-optimization program which includes automated exposure control, adjustment of the mA and/or kV according to patient size and/or use of iterative reconstruction technique. COMPARISON:  None Available. FINDINGS: Brain: No evidence of acute infarction, hemorrhage, hydrocephalus, extra-axial collection or mass lesion/mass effect. Vascular: No hyperdense vessel or unexpected calcification. Skull: Normal. Negative for fracture or focal lesion. Sinuses/Orbits: No middle ear or mastoid effusion. Trace mucosal thickening of the floor of the left maxillary sinus. Orbits are unremarkable. Other: None. IMPRESSION: No acute intracranial  abnormality. No specific etiology for headaches identified. Electronically Signed   By: Lorenza Cambridge M.D.   On: 10/27/2022 16:22   DG Chest Portable 1 View  Result Date: 10/27/2022 CLINICAL DATA:  Chest pain for 4 days.  Abnormal EKG. EXAM: PORTABLE CHEST 1 VIEW COMPARISON:  04/11/2019. FINDINGS: Cardiac silhouette is normal in size and configuration. Normal mediastinal and hilar contours. Clear lungs.  No pleural effusion or pneumothorax. Skeletal structures are grossly intact. IMPRESSION: No active disease. Electronically Signed   By: Amie Portland M.D.   On: 10/27/2022 10:38    Cardiac Studies     Patient Profile     52 y.o. male ith a history of fatty liver disease and thrombocytopenia but no known cardiac history prior to this admission who is being seen for the evaluation of a late presenting STEMI.   Assessment & Plan    STEMI: Patient developed sudden onset of chest pain on evening of 10/25/2022, reported 10 out of 10 chest pain that lasted for 4 hours.  He presented to his PCP's office 5/14 for annual physical and EKG showed ST elevations so he was transported to the ED via EMS. EKG here shows normal sinus rhythm with Q waves and mild ST elevation in inferior leads and leads V3-V6.  This was reviewed with interventional cardiology and suspected late presentation of STEMI, so STEMI alert was not activated.  High-sensitivity troponin downtrending 11, 563 >> 11,386.   Echo shows EF 50 to 55%, normal RV function, no significant valvular disease.  Plan was for cath on 5/15, but patient developed acute chest pain around 6 AM on 5/15 and worsening ST elevations on EKG.  STEMI alert activated, found to have occluded RPDA 3, underwent successful PCI with DES.  Also noted to have 35% proximal mid RCA stenosis, 40% RPDA 1, 50% RPDA 2, 50% ramus, 40% D1.  EF appeared 45 to 50% on cath. - Continue ASA 81 mg daily, ticagrelor 90 mg twice daily - Started atorvastatin 80 mg daily.  Lipid panel pending -  Add metoprolol 12.5 mg BID   Headache: Patient reports a 10/10 headache since the evening of 10/25/2022. He states it feels like a sinus headache. No other neurologic symptoms.  Head CT showed no acute intracranial abnormality -Tylenol as needed   Chronic Thrombocytopenia: history of chronic thrombocytopenia with baseline platelets in the 90s to low 100s. Platelets stable at 91 on arrival. -  Continue to monitor.   Leukocytosis: WBC 12.2 on admission. Chest x-ray showed no acute findings.  No obvious signs of infection. - Suspect reactive leukocytosis in setting of MI. Continue to monitor.    For questions or updates, please contact [facility] Please consult [email or web address] for contact info under        Signed, Little Ishikawa, MD  10/28/2022, 9:02 AM

## 2022-10-28 NOTE — Interval H&P Note (Signed)
History and Physical Interval Note:    Called to see patient acutely. Developed worsening chest pain at 6 am. Unrelieved with 3 sl Ntg. Became hypotensive with BP 89/50. Appears diaphoretic. Ecg shows worsening ST elevation laterally with inferior Q waves.    Code STEMI activated/ Dr Herbie Baltimore aware   [name] MD, [facility] 10/28/2022 7:15 AM      10/28/2022 7:24 AM  Craig Macias  has presented today for surgery, with the diagnosis of Inferior STEMI.   The various methods of treatment have been discussed with the patient and family. After consideration of risks, benefits and other options for treatment, the patient has consented to  Procedure(s): LEFT HEART CATH AND CORONARY ANGIOGRAPHY (N/A) PERCUTANEOUS CORONARY INTERVENTION   as a surgical intervention.  The patient's history has been reviewed, patient examined, no change in status, stable for surgery.  I have reviewed the patient's chart and labs.  Questions were answered to the patient's satisfaction.    Cath Lab Visit (complete for each Cath Lab visit)  Clinical Evaluation Leading to the Procedure:   ACS: Yes.    Non-ACS:    Anginal Classification: CCS IV  Anti-ischemic medical therapy: Maximal Therapy (2 or more classes of medications)  Non-Invasive Test Results: No non-invasive testing performed  Prior CABG: No previous CABG     [name]

## 2022-10-28 NOTE — TOC Benefit Eligibility Note (Signed)
Patient Product/process development scientist completed.    The patient is currently admitted and upon discharge could be taking Brilinta 90 mg.  The current 30 day co-pay is $30.00.   The patient is insured through [employer]   This test claim was processed through [employer]- copay amounts may vary at other pharmacies due to [employer], or as the patient moves through the different stages of their insurance plan.  Roland Earl, CPHT Pharmacy Patient Advocate Specialist [facility] Patient Advocate Team Direct Number: [phone]  Fax: [phone]

## 2022-10-28 NOTE — Telephone Encounter (Signed)
Pharmacy Patient Advocate Encounter  Insurance verification completed.    The patient is insured through [employer]   The patient is currently admitted and ran test claims for the following: Brilinta.  Copays and coinsurance results were relayed to Inpatient clinical team.            

## 2022-10-28 NOTE — TOC Initial Note (Signed)
Transition of Care [facility]) - Initial/Assessment Note    Patient Details  Name: Craig Macias MRN: [id number] Date of Birth: 1970/09/21  [facility]) CM/SW Contact:    Elliot Cousin, RN Phone Number: [phone] 10/28/2022, 5:34 PM  Clinical Narrative:     spoke to pt at bedside. States he will need a note for work. Explained that if he need additional FMLA/disability paperwork completed the PCP or specialist can complete. Provided pt with a Brilinta copay and 30 day free trial card.               Expected Discharge Plan: Home/Self Care Barriers to Discharge: No Barriers Identified   Patient Goals and CMS Choice Patient states their goals for this hospitalization and ongoing recovery are:: wants to get stronger          Expected Discharge Plan and Services   Discharge Planning Services: CM Consult   Living arrangements for the past 2 months: Single Family Home                                      Prior Living Arrangements/Services Living arrangements for the past 2 months: Single Family Home Lives with:: Spouse   Do you feel safe going back to the place where you live?: Yes      Need for Family Participation in Patient Care: No (Comment) Care giver support system in place?: No (comment)   Criminal Activity/Legal Involvement Pertinent to Current Situation/Hospitalization: No - Comment as needed  Activities of Daily Living      Permission Sought/Granted Permission sought to share information with : Case Manager, Family Supports Permission granted to share information with : Yes, Verbal Permission Granted  Share Information with NAME: Sherlynn Carbon     Permission granted to share info w Relationship: wife  Permission granted to share info w Contact Information: [phone]  Emotional Assessment Appearance:: Appears stated age Attitude/Demeanor/Rapport: Engaged Affect (typically observed): Accepting Orientation: : Oriented to Self, Oriented  to Place, Oriented to  Time, Oriented to Situation   Psych Involvement: No (comment)  Admission diagnosis:  STEMI (ST elevation myocardial infarction) (HCC) [I21.3] NSTEMI (non-ST elevated myocardial infarction) [facility]) [I21.4] Patient Active Problem List   Diagnosis Date Noted   ST elevation myocardial infarction (STEMI) (HCC) 10/27/2022   Acute intractable headache 10/27/2022   STEMI (ST elevation myocardial infarction) (HCC) 10/27/2022   Elevated antinuclear antibody (ANA) level 01/25/2017   Thrombocytopenia (HCC) 01/15/2017   Neutropenia (HCC) 01/15/2017   Macrocytosis without anemia 01/15/2017   Fatigue 01/15/2017   Transaminitis 01/15/2017   PCP:  Patient, No Pcp Per Pharmacy:   [id number] - [place], [place] - [address] AT [address] [facility] [address] [place] [address] Phone: [phone] Fax: [phone]     Social Determinants of Health (SDOH) Social History: SDOH Screenings   Tobacco Use: High Risk (10/27/2022)   SDOH Interventions:     Readmission Risk Interventions     No data to display

## 2022-10-28 NOTE — Care Management (Signed)
  Transition of Care [facility]) Screening Note   Patient Details  Name: Craig Macias Date of Birth: 1971-02-12   Transition of Care [facility]) CM/SW Contact:    Gala Lewandowsky, RN Phone Number: 10/28/2022, 2:29 PM    Transition of Care Department [facility]) has reviewed the patient and no TOC needs have been identified at this time. Patient presented for chest pain-post LHC. Benefits check has been completed for Brilinta and cost is $30.00.  We will continue to monitor patient advancement through interdisciplinary progression rounds. If new patient transition needs arise, please place a TOC consult.

## 2022-10-29 ENCOUNTER — Inpatient Hospital Stay (HOSPITAL_COMMUNITY): Payer: BC Managed Care – PPO

## 2022-10-29 ENCOUNTER — Encounter (HOSPITAL_COMMUNITY): Payer: Self-pay | Admitting: Cardiology

## 2022-10-29 DIAGNOSIS — R079 Chest pain, unspecified: Secondary | ICD-10-CM | POA: Diagnosis not present

## 2022-10-29 DIAGNOSIS — R7401 Elevation of levels of liver transaminase levels: Secondary | ICD-10-CM

## 2022-10-29 DIAGNOSIS — I213 ST elevation (STEMI) myocardial infarction of unspecified site: Secondary | ICD-10-CM | POA: Diagnosis not present

## 2022-10-29 DIAGNOSIS — I2111 ST elevation (STEMI) myocardial infarction involving right coronary artery: Secondary | ICD-10-CM | POA: Diagnosis not present

## 2022-10-29 LAB — ECHOCARDIOGRAM LIMITED
Calc EF: 48.7 %
Height: 73 in
S' Lateral: 3.1 cm
Single Plane A2C EF: 60.5 %
Single Plane A4C EF: 38.1 %
Weight: 3425.07 oz

## 2022-10-29 LAB — COMPREHENSIVE METABOLIC PANEL
ALT: 74 U/L — ABNORMAL HIGH (ref 0–44)
AST: 58 U/L — ABNORMAL HIGH (ref 15–41)
Albumin: 3.6 g/dL (ref 3.5–5.0)
Alkaline Phosphatase: 55 U/L (ref 38–126)
Anion gap: 9 (ref 5–15)
BUN: 12 mg/dL (ref 6–20)
CO2: 24 mmol/L (ref 22–32)
Calcium: 9.1 mg/dL (ref 8.9–10.3)
Chloride: 101 mmol/L (ref 98–111)
Creatinine, Ser: 0.89 mg/dL (ref 0.61–1.24)
GFR, Estimated: >60 mL/min (ref >60)
Glucose, Bld: 107 mg/dL — ABNORMAL HIGH (ref 70–99)
Potassium: 3.7 mmol/L (ref 3.5–5.1)
Sodium: 134 mmol/L — ABNORMAL LOW (ref 135–145)
Total Bilirubin: 1.4 mg/dL — ABNORMAL HIGH (ref 0.3–1.2)
Total Protein: 6.8 g/dL (ref 6.5–8.1)

## 2022-10-29 LAB — URINALYSIS, ROUTINE W REFLEX MICROSCOPIC
Bilirubin Urine: NEGATIVE
Glucose, UA: NEGATIVE mg/dL
Hgb urine dipstick: NEGATIVE
Ketones, ur: NEGATIVE mg/dL
Leukocytes,Ua: NEGATIVE
Nitrite: NEGATIVE
Protein, ur: NEGATIVE mg/dL
Specific Gravity, Urine: 1.009 (ref 1.005–1.030)
pH: 6 (ref 5.0–8.0)

## 2022-10-29 LAB — CBC
HCT: 41.7 % (ref 39.0–52.0)
Hemoglobin: 15 g/dL (ref 13.0–17.0)
MCH: 34.6 pg — ABNORMAL HIGH (ref 26.0–34.0)
MCHC: 36 g/dL (ref 30.0–36.0)
MCV: 96.1 fL (ref 80.0–100.0)
Platelets: 86 10*3/uL — ABNORMAL LOW (ref 150–400)
RBC: 4.34 MIL/uL (ref 4.22–5.81)
RDW: 12.5 % (ref 11.5–15.5)
WBC: 9.8 10*3/uL (ref 4.0–10.5)
nRBC: 0 % (ref 0.0–0.2)

## 2022-10-29 MED ORDER — ENOXAPARIN SODIUM 40 MG/0.4ML IJ SOSY
40.0000 mg | PREFILLED_SYRINGE | INTRAMUSCULAR | Status: DC
  Administered 2022-10-29 – 2022-10-30 (×2): 40 mg via SUBCUTANEOUS
  Filled 2022-10-29 (×2): qty 0.4

## 2022-10-29 MED ORDER — POTASSIUM CHLORIDE CRYS ER 20 MEQ PO TBCR
40.0000 meq | EXTENDED_RELEASE_TABLET | Freq: Once | ORAL | Status: AC
  Administered 2022-10-29: 40 meq via ORAL
  Filled 2022-10-29: qty 2

## 2022-10-29 MED ORDER — METOPROLOL SUCCINATE ER 25 MG PO TB24
25.0000 mg | ORAL_TABLET | Freq: Every day | ORAL | Status: DC
  Administered 2022-10-29 – 2022-10-30 (×2): 25 mg via ORAL
  Filled 2022-10-29 (×2): qty 1

## 2022-10-29 NOTE — Progress Notes (Signed)
CARDIAC REHAB PHASE I   Stopped by to walk with pt and provide MI stent education. X-ray, lab and echo tech all here at room for various tests. Introduced myself and CRP1 program. Pt will remain in hospital overnight. Will return tomorrow for walk and education.   [id number] Woodroe Chen, RN BSN 10/29/2022 1:57 PM

## 2022-10-29 NOTE — Progress Notes (Signed)
Rounding Note    Patient Name: GAD EMGE Date of Encounter: 10/29/2022  [facility] Cardiologist: None   Subjective   Denies any chest pain or dyspnea.  Inpatient Medications    Scheduled Meds:  aspirin EC  81 mg Oral Daily   atorvastatin  80 mg Oral Daily   Chlorhexidine Gluconate Cloth  6 each Topical Daily   loratadine  10 mg Oral Daily   metoprolol succinate  25 mg Oral Daily   sodium chloride flush  3 mL Intravenous Q12H   sodium chloride flush  3 mL Intravenous Q12H   ticagrelor  90 mg Oral BID   Continuous Infusions:  sodium chloride     PRN Meds: sodium chloride, acetaminophen, nitroGLYCERIN, ondansetron (ZOFRAN) IV, sodium chloride flush   Vital Signs    Vitals:   10/29/22 0400 10/29/22 0500 10/29/22 0600 10/29/22 0700  BP: 109/71 110/64 93/71 117/85  Pulse: 82 86 95 100  Resp: 20 (!) 24 17 (!) 21  Temp:      TempSrc:      SpO2: 98% 97% 97% 95%  Weight:      Height:        Intake/Output Summary (Last 24 hours) at 10/29/2022 0804 Last data filed at 10/28/2022 2000 Gross per 24 hour  Intake 1042.83 ml  Output 1550 ml  Net -507.17 ml       10/27/2022    7:06 PM 10/27/2022   10:22 AM 12/14/2017    8:49 AM  Last 3 Weights  Weight (lbs) 214 lb 1.1 oz 216 lb 205 lb 3.2 oz  Weight (kg) 97.1 kg 97.977 kg 93.078 kg      Telemetry    NSR rate 80-100s - Personally Reviewed  ECG    Inferior ST elevations and V3-6, NSR, rate 95 - Personally Reviewed  Physical Exam   GEN: No acute distress.   Neck: No JVD Cardiac: RRR, no murmurs, rubs, or gallops.  Respiratory: Clear to auscultation bilaterally. GI: Soft, nontender, non-distended  MS: No edema; No deformity. Neuro:  Nonfocal  Psych: Normal affect   Labs    High Sensitivity Troponin:   Recent Labs  Lab 10/27/22 1035 10/27/22 1205  TROPONINIHS 11,563* 11,386*      Chemistry Recent Labs  Lab 10/27/22 1035 10/28/22 0940 10/29/22 0107  NA 135 135 134*  K 3.8 4.0 3.7   CL 100 103 101  CO2 24 22 24   GLUCOSE 111* 103* 107*  BUN 14 12 12   CREATININE 0.92 0.85 0.89  CALCIUM 9.7 8.7* 9.1  PROT  --  6.4* 6.8  ALBUMIN  --  3.6 3.6  AST  --  74* 58*  ALT  --  83* 74*  ALKPHOS  --  52 55  BILITOT  --  2.3* 1.4*  GFRNONAA >60 >60 >60  ANIONGAP 11 10 9      Lipids  Recent Labs  Lab 10/28/22 0940  CHOL 148  TRIG 49  HDL 58  LDLCALC 80  CHOLHDL 2.6    Hematology Recent Labs  Lab 10/27/22 1035 10/28/22 0940 10/29/22 0107  WBC 12.2* 12.0* 9.8  RBC 4.64 4.38 4.34  HGB 15.9 15.1 15.0  HCT 44.5 41.7 41.7  MCV 95.9 95.2 96.1  MCH 34.3* 34.5* 34.6*  MCHC 35.7 36.2* 36.0  RDW 12.5 12.4 12.5  PLT 91* 87* 86*    Thyroid No results for input(s): "TSH", "FREET4" in the last 168 hours.  BNPNo results for input(s): "BNP", "PROBNP" in  the last 168 hours.  DDimer No results for input(s): "DDIMER" in the last 168 hours.   Radiology    CARDIAC CATHETERIZATION  Result Date: 10/28/2022   Prox RCA to Mid RCA lesion is 35% stenosed.->  Diffuse disease   RPDA-1 lesion is 40% stenosed.   Culprit lesion: RPDA-2 lesion is 50% stenosed. RPDA-3 lesion is 100% stenosed. RPDA-1 lesion is 50% stenosed.   A drug-eluting stent was successfully placed covering both lesions, using a SYNERGY XD 2.50X24.  Taper postdilation from 2.8 to 2.6 mm. Post intervention, there is a 0% residual stenosis.  TIMI 0 flow pre-, TIMI-3 flow post   ------------------------------   Ramus lesion is 50% stenosed.   1st Diag lesion is 40% stenosed.   RPDA-2 lesion is 50% stenosed.   --------------------------------   There is mild left ventricular systolic dysfunction.  The left ventricular ejection fraction is 45-50% by visual estimate.   LV end diastolic pressure is normal. POST CATH DIAGNOSES: Severe Singel Vessel CAD- 100% thrombotic occlusion of mid rPDA --> treated successfully with Synergy DES 2.5 mm x 24 mm (post-dilated 2.8-2.6 mm); reduced to 0%, TIMI 0 improved to TIMI 3 Otherwise  mild-moderate disease in Ostial RI 50%, prox 1st Diag 40%, dLAD 40% (bridging) Mildly reduced to low norrmal LVEF ~45-50% with Inferior Hypokinesis. Normal LVEDP RECOMMENDATIONS Continue with standard post MI care-TR band removal per protocol Check 2D echo As blood pressure tolerates, titrate GDMT for CAD Anticipating potentially be FasTRACT discharge on 10/29/2022 [name], MD  ECHOCARDIOGRAM COMPLETE  Result Date: 10/27/2022    ECHOCARDIOGRAM REPORT   Patient Name:   SANTOSH MESSIMER Date of Exam: 10/27/2022 Medical Rec #:  [id number]     Height:       73.0 in Accession #:    [id number]    Weight:       216.0 lb Date of Birth:  03/14/1971    BSA:          2.223 m Patient Age:    52 years      BP:           138/96 mmHg Patient Gender: M             HR:           85 bpm. Exam Location:  Inpatient Procedure: 2D Echo, Cardiac Doppler and Color Doppler Indications:   NSTEMI  History:       Patient has no prior history of Echocardiogram examinations.                NSTEMI.  Sonographer:   Lucy Antigua Referring      [id number] Corrin Parker Phys: IMPRESSIONS  1. Left ventricular ejection fraction, by estimation, is 50 to 55%. The left ventricle has low normal function. The left ventricle has no regional wall motion abnormalities. Left ventricular diastolic parameters are consistent with Grade I diastolic dysfunction (impaired relaxation).  2. Right ventricular systolic function is normal. The right ventricular size is normal.  3. The mitral valve is normal in structure. No evidence of mitral valve regurgitation. No evidence of mitral stenosis.  4. The aortic valve is normal in structure. Aortic valve regurgitation is not visualized. No aortic stenosis is present.  5. There is borderline dilatation of the aortic root, measuring 37 mm.  6. The inferior vena cava is normal in size with greater than 50% respiratory variability, suggesting right atrial pressure of 3 mmHg. FINDINGS  Left Ventricle: Left ventricular  ejection fraction, by  estimation, is 50 to 55%. The left ventricle has low normal function. The left ventricle has no regional wall motion abnormalities. The left ventricular internal cavity size was normal in size. There is no left ventricular hypertrophy. Left ventricular diastolic parameters are consistent with Grade I diastolic dysfunction (impaired relaxation). Right Ventricle: The right ventricular size is normal. No increase in right ventricular wall thickness. Right ventricular systolic function is normal. Left Atrium: Left atrial size was normal in size. Right Atrium: Right atrial size was normal in size. Pericardium: There is no evidence of pericardial effusion. Presence of epicardial fat layer. Mitral Valve: The mitral valve is normal in structure. No evidence of mitral valve regurgitation. No evidence of mitral valve stenosis. Tricuspid Valve: The tricuspid valve is normal in structure. Tricuspid valve regurgitation is mild . No evidence of tricuspid stenosis. Aortic Valve: The aortic valve is normal in structure. Aortic valve regurgitation is not visualized. No aortic stenosis is present. Aortic valve mean gradient measures 3.0 mmHg. Aortic valve peak gradient measures 5.3 mmHg. Aortic valve area, by VTI measures 3.94 cm. Pulmonic Valve: The pulmonic valve was normal in structure. Pulmonic valve regurgitation is not visualized. No evidence of pulmonic stenosis. Aorta: There is borderline dilatation of the aortic root, measuring 37 mm. Venous: The inferior vena cava is normal in size with greater than 50% respiratory variability, suggesting right atrial pressure of 3 mmHg. IAS/Shunts: No atrial level shunt detected by color flow Doppler.  LEFT VENTRICLE PLAX 2D LVIDd:         4.15 cm      Diastology LVIDs:         2.75 cm      LV e' medial:    5.22 cm/s LV PW:         1.05 cm      LV E/e' medial:  8.2 LV IVS:        0.95 cm      LV e' lateral:   8.59 cm/s LVOT diam:     2.30 cm      LV E/e' lateral: 5.0  LV SV:         84 LV SV Index:   38 LVOT Area:     4.15 cm  LV Volumes (MOD) LV vol d, MOD A2C: 94.8 ml LV vol d, MOD A4C: 116.0 ml LV vol s, MOD A2C: 43.1 ml LV vol s, MOD A4C: 55.5 ml LV SV MOD A2C:     51.7 ml LV SV MOD A4C:     116.0 ml LV SV MOD BP:      56.5 ml RIGHT VENTRICLE RV S prime:     10.10 cm/s TAPSE (M-mode): 2.6 cm LEFT ATRIUM             Index        RIGHT ATRIUM           Index LA Vol (A2C):   66.9 ml 30.09 ml/m  RA Area:     16.60 cm LA Vol (A4C):   25.0 ml 11.25 ml/m  RA Volume:   43.90 ml  19.75 ml/m LA Biplane Vol: 45.1 ml 20.29 ml/m  AORTIC VALVE AV Area (Vmax):    3.94 cm AV Area (Vmean):   3.70 cm AV Area (VTI):     3.94 cm AV Vmax:           115.00 cm/s AV Vmean:          83.200 cm/s AV VTI:  0.212 m AV Peak Grad:      5.3 mmHg AV Mean Grad:      3.0 mmHg LVOT Vmax:         109.00 cm/s LVOT Vmean:        74.100 cm/s LVOT VTI:          0.201 m LVOT/AV VTI ratio: 0.95  AORTA Ao Root diam: 3.65 cm Ao Asc diam:  3.30 cm MITRAL VALVE MV Area (PHT): 2.59 cm    SHUNTS MV E velocity: 42.80 cm/s  Systemic VTI:  0.20 m MV A velocity: 65.60 cm/s  Systemic Diam: 2.30 cm MV E/A ratio:  0.65 Kardie Tobb DO Electronically signed by Thomasene Ripple DO Signature Date/Time: 10/27/2022/8:13:49 PM    Final    CT HEAD WO CONTRAST ( )  Result Date: 10/27/2022 CLINICAL DATA:  Headache EXAM: CT HEAD WITHOUT CONTRAST TECHNIQUE: Contiguous axial images were obtained from the base of the skull through the vertex without intravenous contrast. RADIATION DOSE REDUCTION: This exam was performed according to the departmental dose-optimization program which includes automated exposure control, adjustment of the mA and/or kV according to patient size and/or use of iterative reconstruction technique. COMPARISON:  None Available. FINDINGS: Brain: No evidence of acute infarction, hemorrhage, hydrocephalus, extra-axial collection or mass lesion/mass effect. Vascular: No hyperdense vessel or unexpected  calcification. Skull: Normal. Negative for fracture or focal lesion. Sinuses/Orbits: No middle ear or mastoid effusion. Trace mucosal thickening of the floor of the left maxillary sinus. Orbits are unremarkable. Other: None. IMPRESSION: No acute intracranial abnormality. No specific etiology for headaches identified. Electronically Signed   By: Lorenza Cambridge M.D.   On: 10/27/2022 16:22   DG Chest Portable 1 View  Result Date: 10/27/2022 CLINICAL DATA:  Chest pain for 4 days.  Abnormal EKG. EXAM: PORTABLE CHEST 1 VIEW COMPARISON:  04/11/2019. FINDINGS: Cardiac silhouette is normal in size and configuration. Normal mediastinal and hilar contours. Clear lungs.  No pleural effusion or pneumothorax. Skeletal structures are grossly intact. IMPRESSION: No active disease. Electronically Signed   By: Amie Portland M.D.   On: 10/27/2022 10:38    Cardiac Studies     Patient Profile     52 y.o. male ith a history of fatty liver disease and thrombocytopenia but no known cardiac history prior to this admission who is being seen for the evaluation of a late presenting STEMI.   Assessment & Plan    STEMI: Patient developed sudden onset of chest pain on evening of 10/25/2022, reported 10 out of 10 chest pain that lasted for 4 hours.  He presented to his PCP's office 5/14 for annual physical and EKG showed ST elevations so he was transported to the ED via EMS. EKG here shows normal sinus rhythm with Q waves and mild ST elevation in inferior leads and leads V3-V6.  This was reviewed with interventional cardiology and suspected late presentation of STEMI, so STEMI alert was not activated.  High-sensitivity troponin downtrending 11, 563 >> 11,386.   Echo shows EF 50 to 55%, normal RV function, no significant valvular disease.  Plan was for cath on 5/15, but patient developed acute chest pain around 6 AM on 5/15 and worsening ST elevations on EKG.  STEMI alert activated, found to have occluded RPDA 3, underwent successful  PCI with DES.  Also noted to have 35% proximal mid RCA stenosis, 40% RPDA 1, 50% RPDA 2, 50% ramus, 40% D1.  EF appeared 45 to 50% on cath. - Continue ASA 81 mg daily,  ticagrelor 90 mg twice daily - Started atorvastatin 80 mg daily.  LDL 80, goal <55 - Continue metoprolol, will consolidate to toprol XL 25 mg daily - Repeat limited echo to reevaluate systolic function since STEMI yesterday morning   Headache: Patient reports a 10/10 headache since the evening of 10/25/2022. He states it feels like a sinus headache. No other neurologic symptoms.  Head CT showed no acute intracranial abnormality -Tylenol as needed   Chronic Thrombocytopenia: history of chronic thrombocytopenia with baseline platelets in the 90s to low 100s. Platelets stable at 91 on arrival. - Continue to monitor, platelets 86 this morning   Leukocytosis: WBC 12.2 on admission. Chest x-ray showed no acute findings.  No obvious signs of infection. - Suspect reactive leukocytosis in setting of MI. WBC has normalized  Transaminitis: mild, has history of fatty liver disease.  Will monitor with starting statin  -Diet heart healthy -DVT PPx: SQ lovenox -Code: Full   For questions or updates, please contact [facility] Please consult [email or web address] for contact info under        Signed, Little Ishikawa, MD  10/29/2022, 8:04 AM

## 2022-10-30 ENCOUNTER — Other Ambulatory Visit (HOSPITAL_COMMUNITY): Payer: Self-pay

## 2022-10-30 ENCOUNTER — Telehealth: Payer: Self-pay | Admitting: Cardiology

## 2022-10-30 DIAGNOSIS — I5021 Acute systolic (congestive) heart failure: Secondary | ICD-10-CM | POA: Diagnosis not present

## 2022-10-30 DIAGNOSIS — I2111 ST elevation (STEMI) myocardial infarction involving right coronary artery: Secondary | ICD-10-CM | POA: Diagnosis not present

## 2022-10-30 DIAGNOSIS — R509 Fever, unspecified: Secondary | ICD-10-CM | POA: Diagnosis not present

## 2022-10-30 DIAGNOSIS — D696 Thrombocytopenia, unspecified: Secondary | ICD-10-CM | POA: Diagnosis not present

## 2022-10-30 LAB — COMPREHENSIVE METABOLIC PANEL
ALT: 63 U/L — ABNORMAL HIGH (ref 0–44)
AST: 43 U/L — ABNORMAL HIGH (ref 15–41)
Albumin: 3.4 g/dL — ABNORMAL LOW (ref 3.5–5.0)
Alkaline Phosphatase: 76 U/L (ref 38–126)
Anion gap: 9 (ref 5–15)
BUN: 14 mg/dL (ref 6–20)
CO2: 27 mmol/L (ref 22–32)
Calcium: 8.9 mg/dL (ref 8.9–10.3)
Chloride: 98 mmol/L (ref 98–111)
Creatinine, Ser: 1.03 mg/dL (ref 0.61–1.24)
GFR, Estimated: >60 mL/min (ref >60)
Glucose, Bld: 102 mg/dL — ABNORMAL HIGH (ref 70–99)
Potassium: 3.9 mmol/L (ref 3.5–5.1)
Sodium: 134 mmol/L — ABNORMAL LOW (ref 135–145)
Total Bilirubin: 1.3 mg/dL — ABNORMAL HIGH (ref 0.3–1.2)
Total Protein: 6.8 g/dL (ref 6.5–8.1)

## 2022-10-30 LAB — CBC
HCT: 39 % (ref 39.0–52.0)
Hemoglobin: 14.1 g/dL (ref 13.0–17.0)
MCH: 34.4 pg — ABNORMAL HIGH (ref 26.0–34.0)
MCHC: 36.2 g/dL — ABNORMAL HIGH (ref 30.0–36.0)
MCV: 95.1 fL (ref 80.0–100.0)
Platelets: 104 10*3/uL — ABNORMAL LOW (ref 150–400)
RBC: 4.1 MIL/uL — ABNORMAL LOW (ref 4.22–5.81)
RDW: 12.2 % (ref 11.5–15.5)
WBC: 6.6 10*3/uL (ref 4.0–10.5)
nRBC: 0 % (ref 0.0–0.2)

## 2022-10-30 LAB — CULTURE, BLOOD (ROUTINE X 2): Special Requests: ADEQUATE

## 2022-10-30 MED ORDER — ATORVASTATIN CALCIUM 80 MG PO TABS
80.0000 mg | ORAL_TABLET | Freq: Every day | ORAL | 3 refills | Status: DC
  Filled 2022-10-30: qty 90, 90d supply, fill #0

## 2022-10-30 MED ORDER — TICAGRELOR 90 MG PO TABS
90.0000 mg | ORAL_TABLET | Freq: Two times a day (BID) | ORAL | 11 refills | Status: DC
  Filled 2022-10-30: qty 60, 30d supply, fill #0

## 2022-10-30 MED ORDER — ASPIRIN 81 MG PO TBEC
81.0000 mg | DELAYED_RELEASE_TABLET | Freq: Every day | ORAL | 12 refills | Status: DC
  Filled 2022-10-30: qty 90, 90d supply, fill #0

## 2022-10-30 MED ORDER — LOSARTAN POTASSIUM 25 MG PO TABS
25.0000 mg | ORAL_TABLET | Freq: Every day | ORAL | Status: DC
  Administered 2022-10-30: 25 mg via ORAL
  Filled 2022-10-30: qty 1

## 2022-10-30 MED ORDER — LOSARTAN POTASSIUM 25 MG PO TABS
25.0000 mg | ORAL_TABLET | Freq: Every day | ORAL | 3 refills | Status: DC
  Filled 2022-10-30: qty 90, 90d supply, fill #0

## 2022-10-30 MED ORDER — METOPROLOL SUCCINATE ER 25 MG PO TB24
25.0000 mg | ORAL_TABLET | Freq: Every day | ORAL | 3 refills | Status: DC
  Filled 2022-10-30: qty 90, 90d supply, fill #0

## 2022-10-30 MED ORDER — NITROGLYCERIN 0.4 MG SL SUBL
0.4000 mg | SUBLINGUAL_TABLET | SUBLINGUAL | 1 refills | Status: AC | PRN
  Filled 2022-10-30: qty 25, 5d supply, fill #0

## 2022-10-30 NOTE — Progress Notes (Signed)
CARDIAC REHAB PHASE I   PRE:  Rate/Rhythm: 86 SR  BP:  Sitting: 134/89      SaO2: 98 RA  MODE:  Ambulation: 370 ft   POST:  Rate/Rhythm: 100 ST  BP:  Sitting: 139/93      SaO2: 96 RA  Pt ambulated independently in hall. Tolerated well with no CP, SOB or dizziness. Returned to chair with call bell and bedside table in reach.  Post MI/stent education including site care, restrictions, antiplatelet therapy importance, exercise guidelines, heart healthy diet, MI booklet, NTG use and CRP2 reviewed. All questions and concerns addressed. Will refer to [facility] for CRP2. Plan for home later today.   [id number]  Woodroe Chen, RN BSN 10/30/2022 9:54 AM

## 2022-10-30 NOTE — Discharge Summary (Signed)
Discharge Summary    Patient ID: Craig Macias MRN: [id number]; DOB: 07-05-1970  Admit date: 10/27/2022 Discharge date: 10/30/2022  PCP:  Patient, No Pcp Per   [facility] Providers Cardiologist:  Little Ishikawa, MD     Discharge Diagnoses    Principal Problem:   ST elevation myocardial infarction (STEMI) [facility]) Active Problems:   Acute intractable headache   STEMI (ST elevation myocardial infarction) (HCC)   Acute systolic heart failure (HCC)   Fever    Diagnostic Studies/Procedures    Echocardiogram 10/27/22 1. Left ventricular ejection fraction, by estimation, is 50 to 55%. The  left ventricle has low normal function. The left ventricle has no regional  wall motion abnormalities. Left ventricular diastolic parameters are  consistent with Grade I diastolic  dysfunction (impaired relaxation).   2. Right ventricular systolic function is normal. The right ventricular  size is normal.   3. The mitral valve is normal in structure. No evidence of mitral valve  regurgitation. No evidence of mitral stenosis.   4. The aortic valve is normal in structure. Aortic valve regurgitation is  not visualized. No aortic stenosis is present.   5. There is borderline dilatation of the aortic root, measuring 37 mm.   6. The inferior vena cava is normal in size with greater than 50%  respiratory variability, suggesting right atrial pressure of 3 mmHg.   Left Heart Catheterization 10/28/22   Prox RCA to Mid RCA lesion is 35% stenosed.->  Diffuse disease   RPDA-1 lesion is 40% stenosed.   Culprit lesion: RPDA-2 lesion is 50% stenosed. RPDA-3 lesion is 100% stenosed. RPDA-1 lesion is 50% stenosed.   A drug-eluting stent was successfully placed covering both lesions, using a SYNERGY XD 2.50X24.  Taper postdilation from 2.8 to 2.6 mm. Post intervention, there is a 0% residual stenosis.  TIMI 0 flow pre-, TIMI-3 flow post   ------------------------------   Ramus lesion is 50%  stenosed.   1st Diag lesion is 40% stenosed.   RPDA-2 lesion is 50% stenosed.   --------------------------------   There is mild left ventricular systolic dysfunction.  The left ventricular ejection fraction is 45-50% by visual estimate.   LV end diastolic pressure is normal.     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 Normal LVEDP    RECOMMENDATIONS Continue with standard post MI care-TR band removal per protocol Check 2D echo As blood pressure tolerates, titrate GDMT for CAD Anticipating potentially be FasTRACT discharge on 10/29/2022  Diagnostic Dominance: Right  Intervention     Limited Echocardiogram 10/29/22 1. Left ventricular ejection fraction, by estimation, is 40-45%. The left  ventricle has mildly decreased function. The left ventricle demonstrates  regional wall motion abnormalities (see scoring diagram/findings for  description). There is mild  concentric left ventricular hypertrophy. There is akinesis of the left  ventricular, mid-apical inferior wall and inferoseptal wall, apical septal  wall and apex.   2. Right ventricular systolic function is normal. The right ventricular  size is normal. Tricuspid regurgitation signal is inadequate for assessing  PA pressure.   3. The mitral valve is normal in structure. No evidence of mitral valve  regurgitation. No evidence of mitral  stenosis.   4. The aortic valve is normal in structure. Aortic valve regurgitation is  not visualized. No aortic stenosis is present.   5. The inferior vena cava is normal in size with greater than 50%  respiratory variability, suggesting right atrial pressure of 3  mmHg.  _____________   History of Present Illness     Craig Macias is a 52 y.o. male with a history of fatty liver disease thrombocytopenia.  He has no known cardiac history.  He presented to the ED on 5/14 for the evaluation of chest pain.  Patient reported that he was in his usual state of health until the evening of 10/25/2022 around 4 PM.  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  Pain was described as a burning sensation with associated pain in the back, bilateral arms, and neck.  He also had shortness of breath, diaphoresis, chills.  Patient initially thought that his pain may be due to indigestion because he was burping.  He also thought it could be musculoskeletal in nature because he had worked out earlier and had been helping his mom.  This severe 10/10 chest pain lasted for about 4 hours and improved to about 2-3/10.  Pain persisted until he came to the ED on 5/14.  Patient denied orthopnea, PND, edema.  He did have occasional palpitations on 10/25/2022, but denied lightheadedness, dizziness, syncope.  He also developed severe 10/10 headache on the evening of 10/25/2022 after the onset of chest pain.  Patient was also concerned that his whole body had been aching, so he was thought that he may have the flu.  Denied recent sick contacts, fever, cough, nasal congestion, nausea, vomiting, abnormal bleeding in urine or stools.  On 5/14, patient went to his PCPs office for an annual physical.  EKG there showed inferior ST elevations.  He was given 324 mg of aspirin and EMS was called.  Of note, patient does have a history of tobacco use.  Patient used to smoke socially (maybe once a month), but denies any tobacco use over the past year.  Patient has a significant family history of heart disease on his mother side.  Maternal great grandfather died of an MI at age 15, maternal grandfather died of CHF at the age of 11, and there are multiple maternal aunts with heart  disease as well.  Hospital Course     Consultants: None   Upon arrival to the ED, patient was mildly hypertensive but his vitals were stable.  EKG showed normal sinus rhythm with Q waves and mild ST elevation in inferior leads and leads V3-V6.  Based on EKG findings and onset of symptoms 2 days prior to presentation, it was suspected that patient had a late presenting STEMI.  hsTn [phone].  Chest x-ray showed no acute findings.  WBC 12.2, hemoglobin 15.9, platelets 91.  Na 135, K 3.8, glucose 111, BUN 14, creatinine 0.92.  Lipase was normal.  Patient was started on IV heparin and was admitted to the cardiology service.  Echocardiogram on 5/14 showed EF 50-55% with no regional wall motion abnormalities, grade 1 diastolic dysfunction, normal RV systolic function, borderline dilation of the aortic root measuring 37 mm.  Since patient had a late presenting STEMI, he was not taken emergently to the Cath Lab but was rather plan for cardiac catheterization on 5/15.  On the morning of 5/15 around 6 AM, patient developed worsening chest pain that was unrelieved with 3 sublingual  nitroglycerin.  Patient became hypotensive with BP 89/50.  EKG showed worsening ST elevation laterally with inferior Q waves.  A code STEMI was activated and patient was taken emergently to the Cath Lab with Dr. Herbie Baltimore.  Patient was found to have an occluded RPDA 3 and underwent successful PCI with DES.  Also noted to have 35% proximal mid RCA stenosis, 40% RPDA 1, 50% RPDA 2, 50% ramus, 40% D1.  EF was estimated to be 45-50% on cath.  He was started on dual antiplatelet therapy with aspirin, Brilinta.  Also started on Lipitor 80 mg daily and metoprolol.  On 5/16, patient underwent repeat limited echocardiogram that showed EF 40-45% with regional wall motion abnormalities, mild LVH, normal RV systolic function.  He developed a fever of 100.6 F on 5/16 and there was no clear source of infection.  Chest x-ray was unremarkable, UA  negative, blood cultures no growth to date.  Suspect fever was reactive following MI.  Due to reduced EF, patient was also started on losartan 25 mg daily.  BP limited further initiation of GDMT.  He was seen by cardiac rehab on 5/17 and ambulated independently in the hall.  He tolerated activity well without chest pain, shortness of breath, dizziness.  STEMI CAD - As above, patient presented with a late presenting STEMI on 5/14.  Developed worsening chest pain on 5/15 and code STEMI was activated.  Patient found to have occluded RPDA free and underwent successful PCI with DES. Also noted to have 35% proximal mid RCA stenosis, 40% RPDA 1, 50% RPDA 2, 50% ramus, 40% D1 - Continue ASA 81 mg daily, ticagrelor 90 mg BID - Started Lipitor 80 mg daily this admission.  LDL 80, goal <55 - Continue Toprol-XL 25 mg daily - Discussed radial site care and provided written instructions on DC summary  - Arranged follow up appointment for 5/24. Provided work note through 5/28   Acute Systolic Heart Failure - Due to MI - Echocardiogram following STEMI on 5/16 showed EF 40-45% - Continue metoprolol succinate 25 mg daily  - Started losartan 25 mg daily  - BP limits additional GDMT   Fever  - Patient had a fever to 100.6 Fahrenheit on 5/16 - No clear source of infection-chest x-ray unremarkable, UA negative, blood cultures with NGTD -Likely fever was reactive following MI  Headache -Patient reported to 10/10 headache since the evening of 5/12.  Symptoms resolved following cath -Continue as needed Tylenol  Chronic thrombocytopenia -Baseline platelets in the 90s-low 100s -Platelets stable at 104 prior to discharge -Continue to monitor as an outpatient after initiation of DAPT  Transaminitis -Mild -Will need LFTs in about 2-3 months after starting statin therapy  Hyperlipidemia -LDL 80, HDL 58, triglycerides 49, total cholesterol 148 -Started Lipitor 80 mg daily this admission -Needs repeat lipid  panel and LFTs in 2-3 months  Did the patient have an acute coronary syndrome (MI, NSTEMI, STEMI, etc) this admission?:  Yes                               AHA/ACC Clinical Performance & Quality Measures: Aspirin prescribed? - Yes ADP Receptor Inhibitor (Plavix/Clopidogrel, Brilinta/Ticagrelor or Effient/Prasugrel) prescribed (includes medically managed patients)? - Yes Beta Blocker prescribed? - Yes High Intensity Statin (Lipitor 40-80mg  or Crestor 20-40mg ) prescribed? - Yes EF assessed during THIS hospitalization? - Yes For EF <40%, was ACEI/ARB prescribed? - Yes For EF <40%, Aldosterone Antagonist (Spironolactone or Eplerenone) prescribed? -  No - Reason:  low BP Cardiac Rehab Phase II ordered (including medically managed patients)? - Yes         _____________  Discharge Vitals Blood pressure 134/89, pulse 89, temperature 99.2 F (37.3 C), temperature source Oral, resp. rate 19, height 6\' 1"  (1.854 m), weight 97.1 kg, SpO2 98 %.  Filed Weights   10/27/22 1022 10/27/22 1906  Weight: 98 kg 97.1 kg    Labs & Radiologic Studies    CBC Recent Labs    10/29/22 0107 10/30/22 0041  WBC 9.8 6.6  HGB 15.0 14.1  HCT 41.7 39.0  MCV 96.1 95.1  PLT 86* 104*   Basic Metabolic Panel Recent Labs    16/10/96 0107 10/30/22 0041  NA 134* 134*  K 3.7 3.9  CL 101 98  CO2 24 27  GLUCOSE 107* 102*  BUN 12 14  CREATININE 0.89 1.03  CALCIUM 9.1 8.9   Liver Function Tests Recent Labs    10/29/22 0107 10/30/22 0041  AST 58* 43*  ALT 74* 63*  ALKPHOS 55 76  BILITOT 1.4* 1.3*  PROT 6.8 6.8  ALBUMIN 3.6 3.4*   No results for input(s): "LIPASE", "AMYLASE" in the last 72 hours.  High Sensitivity Troponin:   Recent Labs  Lab 10/27/22 1035 10/27/22 1205  TROPONINIHS 11,563* 11,386*    BNP Invalid input(s): "POCBNP" D-Dimer No results for input(s): "DDIMER" in the last 72 hours. Hemoglobin A1C Recent Labs    10/28/22 0940  HGBA1C 4.8   Fasting Lipid Panel Recent  Labs    10/28/22 0940  CHOL 148  HDL 58  LDLCALC 80  TRIG 49  CHOLHDL 2.6   Thyroid Function Tests No results for input(s): "TSH", "T4TOTAL", "T3FREE", "THYROIDAB" in the last 72 hours.  Invalid input(s): "FREET3" _____________  DG CHEST PORT 1 VIEW  Result Date: 10/29/2022 CLINICAL DATA:  045409 Fever [id number] EXAM: PORTABLE CHEST 1 VIEW COMPARISON:  May 14th 2024 FINDINGS: The cardiomediastinal silhouette is unchanged in contour. No pleural effusion. No pneumothorax. No acute pleuroparenchymal abnormality. IMPRESSION: No acute cardiopulmonary abnormality. Electronically Signed   By: Meda Klinefelter M.D.   On: 10/29/2022 15:13   ECHOCARDIOGRAM LIMITED  Result Date: 10/29/2022    ECHOCARDIOGRAM LIMITED REPORT   Patient Name:   Craig Macias Date of Exam: 10/29/2022 Medical Rec #:  [id number]     Height:       73.0 in Accession #:    [id number]    Weight:       214.1 lb Date of Birth:  25-Nov-1970    BSA:          2.215 m Patient Age:    51 years      BP:           105/82 mmHg Patient Gender: M             HR:           100 bpm. Exam Location:  Inpatient Procedure: Limited Echo and Color Doppler Indications:    122-I22.9 Subsequent ST elevation (STEM) and non-ST elevation                 (NSTEMI) myocardial infarction  History:        Patient has prior history of Echocardiogram examinations, most                 recent 10/27/2022. Acute MI.  Sonographer:    [facility] Referring Phys: [id number] CHRISTOPHER L SCHUMANN IMPRESSIONS  1. Left ventricular  ejection fraction, by estimation, is 40-45%. The left ventricle has mildly decreased function. The left ventricle demonstrates regional wall motion abnormalities (see scoring diagram/findings for description). There is mild concentric left ventricular hypertrophy. There is akinesis of the left ventricular, mid-apical inferior wall and inferoseptal wall, apical septal wall and apex.  2. Right ventricular systolic function is normal. The right ventricular size is  normal. Tricuspid regurgitation signal is inadequate for assessing PA pressure.  3. The mitral valve is normal in structure. No evidence of mitral valve regurgitation. No evidence of mitral stenosis.  4. The aortic valve is normal in structure. Aortic valve regurgitation is not visualized. No aortic stenosis is present.  5. The inferior vena cava is normal in size with greater than 50% respiratory variability, suggesting right atrial pressure of 3 mmHg. FINDINGS  Left Ventricle: Left ventricular ejection fraction, by estimation, is 40 to 45%. The left ventricle has mildly decreased function. The left ventricle demonstrates regional wall motion abnormalities. The left ventricular internal cavity size was normal in size. There is mild concentric left ventricular hypertrophy. Left ventricular diastolic function could not be evaluated.  LV Wall Scoring: The mid and distal anterior septum, mid and distal inferior wall, mid inferoseptal segment, and apex are akinetic. The entire anterior wall, entire lateral wall, basal anteroseptal segment, basal inferior segment, and basal inferoseptal segment are normal. Right Ventricle: The right ventricular size is normal. No increase in right ventricular wall thickness. Right ventricular systolic function is normal. Tricuspid regurgitation signal is inadequate for assessing PA pressure. Left Atrium: Left atrial size was normal in size. Right Atrium: Right atrial size was normal in size. Pericardium: There is no evidence of pericardial effusion. Mitral Valve: The mitral valve is normal in structure. No evidence of mitral valve stenosis. Tricuspid Valve: The tricuspid valve is normal in structure. Tricuspid valve regurgitation is not demonstrated. No evidence of tricuspid stenosis. Aortic Valve: The aortic valve is normal in structure. Aortic valve regurgitation is not visualized. No aortic stenosis is present. Pulmonic Valve: The pulmonic valve was normal in structure. Pulmonic valve  regurgitation is not visualized. No evidence of pulmonic stenosis. Aorta: The aortic root is normal in size and structure. Venous: The inferior vena cava is normal in size with greater than 50% respiratory variability, suggesting right atrial pressure of 3 mmHg. IAS/Shunts: No atrial level shunt detected by color flow Doppler. Additional Comments: Color Doppler performed.  LEFT VENTRICLE PLAX 2D LVIDd:         4.10 cm LVIDs:         3.10 cm LV PW:         1.20 cm LV IVS:        1.20 cm  LV Volumes (MOD) LV vol d, MOD A2C: 73.5 ml LV vol d, MOD A4C: 59.6 ml LV vol s, MOD A2C: 29.0 ml LV vol s, MOD A4C: 36.9 ml LV SV MOD A2C:     44.5 ml LV SV MOD A4C:     59.6 ml LV SV MOD BP:      32.4 ml [name] MD Electronically signed by [name] MD Signature Date/Time: 10/29/2022/2:46:48 PM    Final    CARDIAC CATHETERIZATION  Result Date: 10/28/2022   Prox RCA to Mid RCA lesion is 35% stenosed.->  Diffuse disease   RPDA-1 lesion is 40% stenosed.   Culprit lesion: RPDA-2 lesion is 50% stenosed. RPDA-3 lesion is 100% stenosed. RPDA-1 lesion is 50% stenosed.   A drug-eluting stent was successfully placed covering  both lesions, using a SYNERGY XD 2.50X24.  Taper postdilation from 2.8 to 2.6 mm. Post intervention, there is a 0% residual stenosis.  TIMI 0 flow pre-, TIMI-3 flow post   ------------------------------   Ramus lesion is 50% stenosed.   1st Diag lesion is 40% stenosed.   RPDA-2 lesion is 50% stenosed.   --------------------------------   There is mild left ventricular systolic dysfunction.  The left ventricular ejection fraction is 45-50% by visual estimate.   LV end diastolic pressure is normal. 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 Normal LVEDP RECOMMENDATIONS Continue with standard post MI care-TR band removal per protocol Check 2D echo As blood pressure tolerates, titrate GDMT for CAD Anticipating potentially be FasTRACT discharge on 10/29/2022 [name], MD  ECHOCARDIOGRAM COMPLETE  Result Date: 10/27/2022    ECHOCARDIOGRAM REPORT   Patient Name:   Craig Macias Date of Exam: 10/27/2022 Medical Rec #:  [id number]     Height:       73.0 in Accession #:    [id number]    Weight:       216.0 lb Date of Birth:  1970/07/07    BSA:          2.223 m Patient Age:    51 years      BP:           138/96 mmHg Patient Gender: M             HR:           85 bpm. Exam Location:  Inpatient Procedure: 2D Echo, Cardiac Doppler and Color Doppler Indications:   NSTEMI  History:       Patient has no prior history of Echocardiogram examinations.                NSTEMI.  Sonographer:   Lucy Antigua Referring      [id number] Corrin Parker Phys: IMPRESSIONS  1. Left ventricular ejection fraction, by estimation, is 50 to 55%. The left ventricle has low normal function. The left ventricle has no regional wall motion abnormalities. Left ventricular diastolic parameters are consistent with Grade I diastolic dysfunction (impaired relaxation).  2. Right ventricular systolic function is normal. The right ventricular size is normal.  3. The mitral valve is normal in structure. No evidence of mitral valve regurgitation. No evidence of mitral stenosis.  4. The aortic valve is normal in structure. Aortic valve regurgitation is not visualized. No aortic stenosis is present.  5. There is borderline dilatation of the aortic root, measuring 37 mm.  6. The inferior vena cava is normal in size with greater than 50% respiratory variability, suggesting right atrial pressure of 3 mmHg. FINDINGS  Left Ventricle: Left ventricular ejection fraction, by estimation, is 50 to 55%. The left ventricle has low normal function. The left  ventricle has no regional wall motion abnormalities. The left ventricular internal cavity size was normal in size. There is no left ventricular hypertrophy. Left ventricular diastolic parameters are consistent with Grade I diastolic dysfunction (impaired relaxation). Right Ventricle: The right ventricular size is normal. No increase in right ventricular wall thickness. Right ventricular systolic function is normal. Left Atrium: Left atrial size was  normal in size. Right Atrium: Right atrial size was normal in size. Pericardium: There is no evidence of pericardial effusion. Presence of epicardial fat layer. Mitral Valve: The mitral valve is normal in structure. No evidence of mitral valve regurgitation. No evidence of mitral valve stenosis. Tricuspid Valve: The tricuspid valve is normal in structure. Tricuspid valve regurgitation is mild . No evidence of tricuspid stenosis. Aortic Valve: The aortic valve is normal in structure. Aortic valve regurgitation is not visualized. No aortic stenosis is present. Aortic valve mean gradient measures 3.0 mmHg. Aortic valve peak gradient measures 5.3 mmHg. Aortic valve area, by VTI measures 3.94 cm. Pulmonic Valve: The pulmonic valve was normal in structure. Pulmonic valve regurgitation is not visualized. No evidence of pulmonic stenosis. Aorta: There is borderline dilatation of the aortic root, measuring 37 mm. Venous: The inferior vena cava is normal in size with greater than 50% respiratory variability, suggesting right atrial pressure of 3 mmHg. IAS/Shunts: No atrial level shunt detected by color flow Doppler.  LEFT VENTRICLE PLAX 2D LVIDd:         4.15 cm      Diastology LVIDs:         2.75 cm      LV e' medial:    5.22 cm/s LV PW:         1.05 cm      LV E/e' medial:  8.2 LV IVS:        0.95 cm      LV e' lateral:   8.59 cm/s LVOT diam:     2.30 cm      LV E/e' lateral: 5.0 LV SV:         84 LV SV Index:   38 LVOT Area:     4.15 cm  LV Volumes (MOD) LV vol d, MOD A2C:  94.8 ml LV vol d, MOD A4C: 116.0 ml LV vol s, MOD A2C: 43.1 ml LV vol s, MOD A4C: 55.5 ml LV SV MOD A2C:     51.7 ml LV SV MOD A4C:     116.0 ml LV SV MOD BP:      56.5 ml RIGHT VENTRICLE RV S prime:     10.10 cm/s TAPSE (M-mode): 2.6 cm LEFT ATRIUM             Index        RIGHT ATRIUM           Index LA Vol (A2C):   66.9 ml 30.09 ml/m  RA Area:     16.60 cm LA Vol (A4C):   25.0 ml 11.25 ml/m  RA Volume:   43.90 ml  19.75 ml/m LA Biplane Vol: 45.1 ml 20.29 ml/m  AORTIC VALVE AV Area (Vmax):    3.94 cm AV Area (Vmean):   3.70 cm AV Area (VTI):     3.94 cm AV Vmax:           115.00 cm/s AV Vmean:          83.200 cm/s AV VTI:            0.212 m AV Peak Grad:      5.3 mmHg AV Mean Grad:      3.0 mmHg LVOT Vmax:         109.00 cm/s LVOT Vmean:        74.100 cm/s LVOT VTI:          0.201 m LVOT/AV VTI ratio: 0.95  AORTA Ao Root diam: 3.65 cm Ao  Asc diam:  3.30 cm MITRAL VALVE MV Area (PHT): 2.59 cm    SHUNTS MV E velocity: 42.80 cm/s  Systemic VTI:  0.20 m MV A velocity: 65.60 cm/s  Systemic Diam: 2.30 cm MV E/A ratio:  0.65 Kardie Tobb DO Electronically signed by Thomasene Ripple DO Signature Date/Time: 10/27/2022/8:13:49 PM    Final    CT HEAD WO CONTRAST ( )  Result Date: 10/27/2022 CLINICAL DATA:  Headache EXAM: CT HEAD WITHOUT CONTRAST TECHNIQUE: Contiguous axial images were obtained from the base of the skull through the vertex without intravenous contrast. RADIATION DOSE REDUCTION: This exam was performed according to the departmental dose-optimization program which includes automated exposure control, adjustment of the mA and/or kV according to patient size and/or use of iterative reconstruction technique. COMPARISON:  None Available. FINDINGS: Brain: No evidence of acute infarction, hemorrhage, hydrocephalus, extra-axial collection or mass lesion/mass effect. Vascular: No hyperdense vessel or unexpected calcification. Skull: Normal. Negative for fracture or focal lesion. Sinuses/Orbits: No middle ear or  mastoid effusion. Trace mucosal thickening of the floor of the left maxillary sinus. Orbits are unremarkable. Other: None. IMPRESSION: No acute intracranial abnormality. No specific etiology for headaches identified. Electronically Signed   By: Lorenza Cambridge M.D.   On: 10/27/2022 16:22   DG Chest Portable 1 View  Result Date: 10/27/2022 CLINICAL DATA:  Chest pain for 4 days.  Abnormal EKG. EXAM: PORTABLE CHEST 1 VIEW COMPARISON:  04/11/2019. FINDINGS: Cardiac silhouette is normal in size and configuration. Normal mediastinal and hilar contours. Clear lungs.  No pleural effusion or pneumothorax. Skeletal structures are grossly intact. IMPRESSION: No active disease. Electronically Signed   By: Amie Portland M.D.   On: 10/27/2022 10:38   Disposition   Pt is being discharged home today in good condition.  Follow-up Plans & Appointments     Follow-up Information     Farris Has, MD Follow up.   Specialty: Family Medicine Why: please keep your appt on 11/12/2022 at 1130 am Contact information: [address] [place] [address] [phone]                Discharge Instructions     Amb Referral to Cardiac Rehabilitation   Complete by: As directed    Diagnosis:  STEMI Coronary Stents     After initial evaluation and assessments completed: Virtual Based Care may be provided alone or in conjunction with Phase 2 Cardiac Rehab based on patient barriers.: Yes   Intensive Cardiac Rehabilitation (ICR) [facility] location only OR Traditional Cardiac Rehabilitation (TCR) *If criteria for ICR are not met will enroll in TCR [facility] only): Yes   Diet - low sodium heart healthy   Complete by: As directed    Discharge instructions   Complete by: As directed    Radial Site Care Refer to this sheet in the next few weeks. These instructions provide you with information on caring for yourself after your procedure. Your caregiver may also give you more specific instructions. Your  treatment has been planned according to current medical practices, but problems sometimes occur. Call your caregiver if you have any problems or questions after your procedure. HOME CARE INSTRUCTIONS You may shower the day after the procedure. Remove the bandage (dressing) and gently wash the site with plain soap and water. Gently pat the site dry.  Do not apply powder or lotion to the site.  Do not submerge the affected site in water for 3 to 5 days.  Inspect the site at least twice  daily.  Do not flex or bend the affected arm for 24 hours.  No lifting over 5 pounds (2.3 kg) for 5 days after your procedure.  Do not drive home if you are discharged the same day of the procedure. Have someone else drive you.  You may drive 24 hours after the procedure unless otherwise instructed by your caregiver.  What to expect: Any bruising will usually fade within 1 to 2 weeks.  Blood that collects in the tissue (hematoma) may be painful to the touch. It should usually decrease in size and tenderness within 1 to 2 weeks.  SEEK IMMEDIATE MEDICAL CARE IF: You have unusual pain at the radial site.  You have redness, warmth, swelling, or pain at the radial site.  You have drainage (other than a small amount of blood on the dressing).  You have chills.  You have a fever or persistent symptoms for more than 72 hours.  You have a fever and your symptoms suddenly get worse.  Your arm becomes pale, cool, tingly, or numb.  You have heavy bleeding from the site. Hold pressure on the site.  PLEASE DO NOT MISS ANY DOSES OF YOUR BRILINTA!!!!! Also keep a log of you blood pressures and bring back to your follow up appt. Please call the office with any questions.   Patients taking blood thinners should generally stay away from medicines like ibuprofen, Advil, Motrin, naproxen, and Aleve due to risk of stomach bleeding. You may take Tylenol as directed or talk to your primary doctor about alternatives.  PLEASE ENSURE  THAT YOU DO NOT RUN OUT OF YOUR BRILINTA. This medication is very important to remain on for at least one year. IF you have issues obtaining this medication due to cost please CALL the office 3-5 business days prior to running out in order to prevent missing doses of this medication.   Increase activity slowly   Complete by: As directed         Discharge Medications   Allergies as of 10/30/2022       Reactions   Amoxil [amoxicillin] Swelling, Rash   Biaxin [clarithromycin] Swelling   Penicillins Swelling, Rash        Medication List     STOP taking these medications    predniSONE 20 MG tablet Commonly known as: DELTASONE       TAKE these medications    ASCORBIC ACID PO Take 1 tablet by mouth daily. Vitamin C, unknown strength.   aspirin EC 81 MG tablet Take 1 tablet (81 mg total) by mouth daily. Swallow whole. Start taking on: Oct 31, 2022   atorvastatin 80 MG tablet Commonly known as: LIPITOR Take 1 tablet (80 mg total) by mouth daily. Start taking on: Oct 31, 2022   fexofenadine 180 MG tablet Commonly known as: ALLEGRA Take 180 mg by mouth daily.   ibuprofen 200 MG tablet Commonly known as: ADVIL Take 400 mg by mouth daily as needed for headache or moderate pain.   losartan 25 MG tablet Commonly known as: COZAAR Take 1 tablet (25 mg total) by mouth daily. Start taking on: Oct 31, 2022   MAGNESIUM OXIDE PO Take 1 tablet by mouth at bedtime.   metoprolol succinate 25 MG 24 hr tablet Commonly known as: TOPROL-XL Take 1 tablet (25 mg total) by mouth daily. Start taking on: Oct 31, 2022   nitroGLYCERIN 0.4 MG SL tablet Commonly known as: NITROSTAT Place 1 tablet (0.4 mg total) under the tongue every 5 (five)  minutes x 3 doses as needed for chest pain.   pseudoephedrine 30 MG tablet Commonly known as: SUDAFED Take 30 mg by mouth 2 (two) times daily as needed for congestion.   ticagrelor 90 MG Tabs tablet Commonly known as: BRILINTA Take 1 tablet  (90 mg total) by mouth 2 (two) times daily.           Outstanding Labs/Studies    Duration of Discharge Encounter   Greater than 30 minutes including physician time.  Signed, Jonita Albee, PA-C 10/30/2022, 11:54 AM

## 2022-10-30 NOTE — Telephone Encounter (Signed)
   Transition of Care Follow-up Phone Call Request    Patient Name: Craig Macias Date of Birth: Nov 06, 1970 Date of Encounter: 10/30/2022  Primary Care Provider:  Patient, No Pcp Per Primary Cardiologist:  Little Ishikawa, MD  Craig Macias has been scheduled for a transition of care follow up appointment with a [facility] provider: Dr. Bjorn Pippin on 11/06/22     Please reach out to Craig Macias within 48 hours to confirm appointment.  Jonita Albee, PA-C  10/30/2022, 11:56 AM

## 2022-10-30 NOTE — TOC Initial Note (Signed)
Transition of Care [facility]) - Initial/Assessment Note    Patient Details  Name: Craig Macias MRN: [id number] Date of Birth: Dec 10, 1970  Transition of Care [facility] CM/SW Contact:    Elliot Cousin, RN Phone Number: 10/30/2022, 11:25 AM  Clinical Narrative:     TOC CM spoke to pt and wife at bedside. Wife states she will schedule follow up appt with pt's PCP, at [facility]. Pt requesting a note for work. Message sent to attending. Pt has Brilinta $5 copay card. Explained to wife how to use card at pharmacy. Educated she can use each month.        PCP Dr Kateri Plummer 5/30 at 1130 am    Expected Discharge Plan: Home/Self Care Barriers to Discharge: No Barriers Identified   Patient Goals and CMS Choice Patient states their goals for this hospitalization and ongoing recovery are:: wants to get stronger          Expected Discharge Plan and Services   Discharge Planning Services: CM Consult   Living arrangements for the past 2 months: Single Family Home                                      Prior Living Arrangements/Services Living arrangements for the past 2 months: Single Family Home Lives with:: Spouse   Do you feel safe going back to the place where you live?: Yes      Need for Family Participation in Patient Care: No (Comment) Care giver support system in place?: No (comment)   Criminal Activity/Legal Involvement Pertinent to Current Situation/Hospitalization: No - Comment as needed  Activities of Daily Living      Permission Sought/Granted Permission sought to share information with : PCP Permission granted to share information with : Yes, Verbal Permission Granted  Share Information with NAME: Craig Macias     Permission granted to share info w Relationship: wife  Permission granted to share info w Contact Information: [phone]  Emotional Assessment Appearance:: Appears stated age Attitude/Demeanor/Rapport: Engaged Affect (typically observed):  Accepting Orientation: : Oriented to Self, Oriented to Place, Oriented to  Time, Oriented to Situation   Psych Involvement: No (comment)  Admission diagnosis:  STEMI (ST elevation myocardial infarction) (HCC) [I21.3] NSTEMI (non-ST elevated myocardial infarction) [facility]) [I21.4] Patient Active Problem List   Diagnosis Date Noted   Acute systolic heart failure (HCC) 10/30/2022   Fever 10/30/2022   ST elevation myocardial infarction (STEMI) (HCC) 10/27/2022   Acute intractable headache 10/27/2022   STEMI (ST elevation myocardial infarction) (HCC) 10/27/2022   Elevated antinuclear antibody (ANA) level 01/25/2017   Thrombocytopenia (HCC) 01/15/2017   Neutropenia (HCC) 01/15/2017   Macrocytosis without anemia 01/15/2017   Fatigue 01/15/2017   Transaminitis 01/15/2017   PCP:  Patient, No Pcp Per Pharmacy:   [facility] [address] [place] [address] Phone: [phone] Fax: [phone]  [id number] - , [place] - [address] AT [address] OF [facility] [address] [place] [address] Phone: [phone] Fax: [phone]     Social Determinants of Health (SDOH) Social History: SDOH Screenings   Food Insecurity: No Food Insecurity (10/28/2022)  Housing: Low Risk  (10/28/2022)  Transportation Needs: No Transportation Needs (10/28/2022)  Utilities: Not At Risk (10/28/2022)  Financial Resource Strain: Low Risk  (10/28/2022)  Tobacco Use: High Risk (10/29/2022)   SDOH Interventions: Food Insecurity Interventions: Intervention Not Indicated Housing Interventions: Intervention  Not Indicated Transportation Interventions: Intervention Not Indicated Utilities Interventions: Intervention Not Indicated Financial Strain Interventions: Intervention Not Indicated   Readmission Risk Interventions     No data to display

## 2022-10-30 NOTE — Progress Notes (Signed)
Rounding Note    Patient Name: Craig Macias Date of Encounter: 10/30/2022  [facility] Cardiologist: None   Subjective   Fever to 100.6 yesterday afternoon.  BP 104/71 this morning. Denies any chest pain or dyspnea.  Inpatient Medications    Scheduled Meds:  aspirin EC  81 mg Oral Daily   atorvastatin  80 mg Oral Daily   Chlorhexidine Gluconate Cloth  6 each Topical Daily   enoxaparin (LOVENOX) injection  40 mg Subcutaneous Q24H   loratadine  10 mg Oral Daily   metoprolol succinate  25 mg Oral Daily   sodium chloride flush  3 mL Intravenous Q12H   sodium chloride flush  3 mL Intravenous Q12H   ticagrelor  90 mg Oral BID   Continuous Infusions:  sodium chloride     PRN Meds: sodium chloride, acetaminophen, nitroGLYCERIN, ondansetron (ZOFRAN) IV, sodium chloride flush   Vital Signs    Vitals:   10/30/22 0400 10/30/22 0500 10/30/22 0600 10/30/22 0700  BP: 110/72 129/63 123/78 104/71  Pulse: 88 86 81 89  Resp: (!) 23 (!) 23 17 18   Temp:      TempSrc: Oral     SpO2: 96% 97% 96% 97%  Weight:      Height:        Intake/Output Summary (Last 24 hours) at 10/30/2022 0805 Last data filed at 10/30/2022 0700 Gross per 24 hour  Intake 360 ml  Output 551 ml  Net -191 ml       10/27/2022    7:06 PM 10/27/2022   10:22 AM 12/14/2017    8:49 AM  Last 3 Weights  Weight (lbs) 214 lb 1.1 oz 216 lb 205 lb 3.2 oz  Weight (kg) 97.1 kg 97.977 kg 93.078 kg      Telemetry    NSR rate 80-90s - Personally Reviewed  ECG    Inferior ST elevations and V3-6, NSR, rate 95 - Personally Reviewed  Physical Exam   GEN: No acute distress.   Neck: No JVD Cardiac: RRR, no murmurs, rubs, or gallops.  Respiratory: Clear to auscultation bilaterally. GI: Soft, nontender, non-distended  MS: No edema; No deformity. Neuro:  Nonfocal  Psych: Normal affect   Labs    High Sensitivity Troponin:   Recent Labs  Lab 10/27/22 1035 10/27/22 1205  TROPONINIHS 11,563* 11,386*       Chemistry Recent Labs  Lab 10/28/22 0940 10/29/22 0107 10/30/22 0041  NA 135 134* 134*  K 4.0 3.7 3.9  CL 103 101 98  CO2 22 24 27   GLUCOSE 103* 107* 102*  BUN 12 12 14   CREATININE 0.85 0.89 1.03  CALCIUM 8.7* 9.1 8.9  PROT 6.4* 6.8 6.8  ALBUMIN 3.6 3.6 3.4*  AST 74* 58* 43*  ALT 83* 74* 63*  ALKPHOS 52 55 76  BILITOT 2.3* 1.4* 1.3*  GFRNONAA >60 >60 >60  ANIONGAP 10 9 9      Lipids  Recent Labs  Lab 10/28/22 0940  CHOL 148  TRIG 49  HDL 58  LDLCALC 80  CHOLHDL 2.6     Hematology Recent Labs  Lab 10/28/22 0940 10/29/22 0107 10/30/22 0041  WBC 12.0* 9.8 6.6  RBC 4.38 4.34 4.10*  HGB 15.1 15.0 14.1  HCT 41.7 41.7 39.0  MCV 95.2 96.1 95.1  MCH 34.5* 34.6* 34.4*  MCHC 36.2* 36.0 36.2*  RDW 12.4 12.5 12.2  PLT 87* 86* 104*    Thyroid No results for input(s): "TSH", "FREET4" in the last 168  hours.  BNPNo results for input(s): "BNP", "PROBNP" in the last 168 hours.  DDimer No results for input(s): "DDIMER" in the last 168 hours.   Radiology    DG CHEST PORT 1 VIEW  Result Date: 10/29/2022 CLINICAL DATA:  [id number] Fever [id number] EXAM: PORTABLE CHEST 1 VIEW COMPARISON:  May 14th 2024 FINDINGS: The cardiomediastinal silhouette is unchanged in contour. No pleural effusion. No pneumothorax. No acute pleuroparenchymal abnormality. IMPRESSION: No acute cardiopulmonary abnormality. Electronically Signed   By: Meda Klinefelter M.D.   On: 10/29/2022 15:13   ECHOCARDIOGRAM LIMITED  Result Date: 10/29/2022    ECHOCARDIOGRAM LIMITED REPORT   Patient Name:   Craig Macias Date of Exam: 10/29/2022 Medical Rec #:  [id number]     Height:       73.0 in Accession #:    [id number]    Weight:       214.1 lb Date of Birth:  May 06, 1971    BSA:          2.215 m Patient Age:    52 years      BP:           105/82 mmHg Patient Gender: M             HR:           100 bpm. Exam Location:  Inpatient Procedure: Limited Echo and Color Doppler Indications:    122-I22.9 Subsequent ST  elevation (STEM) and non-ST elevation                 (NSTEMI) myocardial infarction  History:        Patient has prior history of Echocardiogram examinations, most                 recent 10/27/2022. Acute MI.  Sonographer:    [facility] Referring Phys: [id number] Cathline Dowen L Atianna Haidar IMPRESSIONS  1. Left ventricular ejection fraction, by estimation, is 40-45%. The left ventricle has mildly decreased function. The left ventricle demonstrates regional wall motion abnormalities (see scoring diagram/findings for description). There is mild concentric left ventricular hypertrophy. There is akinesis of the left ventricular, mid-apical inferior wall and inferoseptal wall, apical septal wall and apex.  2. Right ventricular systolic function is normal. The right ventricular size is normal. Tricuspid regurgitation signal is inadequate for assessing PA pressure.  3. The mitral valve is normal in structure. No evidence of mitral valve regurgitation. No evidence of mitral stenosis.  4. The aortic valve is normal in structure. Aortic valve regurgitation is not visualized. No aortic stenosis is present.  5. The inferior vena cava is normal in size with greater than 50% respiratory variability, suggesting right atrial pressure of 3 mmHg. FINDINGS  Left Ventricle: Left ventricular ejection fraction, by estimation, is 40 to 45%. The left ventricle has mildly decreased function. The left ventricle demonstrates regional wall motion abnormalities. The left ventricular internal cavity size was normal in size. There is mild concentric left ventricular hypertrophy. Left ventricular diastolic function could not be evaluated.  LV Wall Scoring: The mid and distal anterior septum, mid and distal inferior wall, mid inferoseptal segment, and apex are akinetic. The entire anterior wall, entire lateral wall, basal anteroseptal segment, basal inferior segment, and basal inferoseptal segment are normal. Right Ventricle: The right ventricular size is normal.  No increase in right ventricular wall thickness. Right ventricular systolic function is normal. Tricuspid regurgitation signal is inadequate for assessing PA pressure. Left Atrium: Left atrial size was normal in size. Right Atrium: Right atrial  size was normal in size. Pericardium: There is no evidence of pericardial effusion. Mitral Valve: The mitral valve is normal in structure. No evidence of mitral valve stenosis. Tricuspid Valve: The tricuspid valve is normal in structure. Tricuspid valve regurgitation is not demonstrated. No evidence of tricuspid stenosis. Aortic Valve: The aortic valve is normal in structure. Aortic valve regurgitation is not visualized. No aortic stenosis is present. Pulmonic Valve: The pulmonic valve was normal in structure. Pulmonic valve regurgitation is not visualized. No evidence of pulmonic stenosis. Aorta: The aortic root is normal in size and structure. Venous: The inferior vena cava is normal in size with greater than 50% respiratory variability, suggesting right atrial pressure of 3 mmHg. IAS/Shunts: No atrial level shunt detected by color flow Doppler. Additional Comments: Color Doppler performed.  LEFT VENTRICLE PLAX 2D LVIDd:         4.10 cm LVIDs:         3.10 cm LV PW:         1.20 cm LV IVS:        1.20 cm  LV Volumes (MOD) LV vol d, MOD A2C: 73.5 ml LV vol d, MOD A4C: 59.6 ml LV vol s, MOD A2C: 29.0 ml LV vol s, MOD A4C: 36.9 ml LV SV MOD A2C:     44.5 ml LV SV MOD A4C:     59.6 ml LV SV MOD BP:      32.4 ml [name] MD Electronically signed by [name] MD Signature Date/Time: 10/29/2022/2:46:48 PM    Final     Cardiac Studies     Patient Profile     52 y.o. male ith a history of fatty liver disease and thrombocytopenia but no known cardiac history prior to this admission who is being seen for the evaluation of a late presenting STEMI.   Assessment & Plan    STEMI: Patient developed sudden onset of chest pain on evening of 10/25/2022, reported 10 out of 10  chest pain that lasted for 4 hours.  He presented to his PCP's office 5/14 for annual physical and EKG showed ST elevations so he was transported to the ED via EMS. EKG here shows normal sinus rhythm with Q waves and mild ST elevation in inferior leads and leads V3-V6.  This was reviewed with interventional cardiology and suspected late presentation of STEMI, so STEMI alert was not activated.  High-sensitivity troponin downtrending 11, 563 >> 11,386.   Echo shows EF 50 to 55%, normal RV function, no significant valvular disease.  Plan was for cath on 5/15, but patient developed acute chest pain around 6 AM on 5/15 and worsening ST elevations on EKG.  STEMI alert activated, found to have occluded RPDA 3, underwent successful PCI with DES.  Also noted to have 35% proximal mid RCA stenosis, 40% RPDA 1, 50% RPDA 2, 50% ramus, 40% D1.  EF appeared 45 to 50% on cath.  Repeat echo 10/29/22 showed EF 40-45% - Continue ASA 81 mg daily, ticagrelor 90 mg twice daily - Started atorvastatin 80 mg daily.  LDL 80, goal <55 - Continue toprol XL 25 mg daily  Acute systolic heart failure: due to MI.  Echo 10/29/22 showed EF 40-45% -Continue toprol XL 25 mg daily -Will add losartan 25 mg daily -Hold off on further GDMT at this time due to soft BP  Fever: to 100.61F on 5/16.  No clear source of infection.  CXR unremarkable, UA negative, blood cultures NGTD.  Suspect reactive following MI.  Headache: Patient reports a 10/10 headache since the evening of 10/25/2022. He states it feels like a sinus headache. No other neurologic symptoms.  Head CT showed no acute intracranial abnormality.  Reports symptoms resolved following cath -Tylenol as needed   Chronic Thrombocytopenia: history of chronic thrombocytopenia with baseline platelets in the 90s to low 100s. Platelets stable at 91 on arrival. - Continue to monitor, platelets stable at 104 this morning  Transaminitis: mild, has history of fatty liver disease.  Will monitor  with starting statin  Disposition: OK for discharge todya   For questions or updates, please contact [facility] Please consult [email or web address] for contact info under        Signed, Little Ishikawa, MD  10/30/2022, 8:05 AM

## 2022-10-31 LAB — CULTURE, BLOOD (ROUTINE X 2)

## 2022-11-01 LAB — CULTURE, BLOOD (ROUTINE X 2)

## 2022-11-02 LAB — CULTURE, BLOOD (ROUTINE X 2)
Culture: NO GROWTH
Culture: NO GROWTH

## 2022-11-03 LAB — CULTURE, BLOOD (ROUTINE X 2): Special Requests: ADEQUATE

## 2022-11-03 NOTE — Telephone Encounter (Signed)
Patient contacted regarding discharge from May 17on  Patient understands to follow up with provider Dr Jerene Pitch on may 24th 10:20.  Patient understands discharge instructions? yes Patient understands medications and regiment? yes Patient understands to bring all medications to this visit? yes  Ask patient:  Are you enrolled in My Chart YES  Postop Surgical Patients:                What is your wound status? None.               Please do not place any creams/ lotions/ or antibiotic ointment on any surgical incisions/ wounds without physician approval.              Do you have any questions about your medications?  All medications (except pain medications) are to be filled by your Cardiologist AFTER your first post op    appointment with them.  Are you taking your pain medication? No pain meds, none needed              How is your pain controlled? Pain level? No pain               If you require a refill on pain medications, know that the same medication/ amount may not be prescribed or a refill may not be given.  Please contact your pharmacy for refill requests.               Do you have help at home with ADL's?  Has friends to help if needed, but has not needed any at this time              Please refer to your Pre/post surgery booklet, there is a lot of useful information in it that may answer any questions you may have.              Please note that it is ok to remove your surgical dressing, shower (soap/ water), and pat the incision dry.  For surgery related questions staff will route a phone note to [facility] pool  [facility] [address] [phone] [place], [place] [address]  For nonsurgery patients please delete the surgery questions.  For patients Patient has no concerns or issues at this time.  Verifies appt. Date and time

## 2022-11-04 ENCOUNTER — Telehealth: Payer: Self-pay | Admitting: Cardiology

## 2022-11-04 NOTE — Telephone Encounter (Signed)
MyChart message sent to patient w/update from PharmD

## 2022-11-04 NOTE — Telephone Encounter (Signed)
Patient states he has a cough and low grade fever and his wife advised him to call and see if that is normal after having a heart cath. I informed him that if he has a fever he may not need to come to his appointment on Friday, 5/24, but he states he still plans on coming in. He just wanted to know if a cough/fever is normal after a cath and if Dr. Bjorn Pippin has recommendations until Friday.

## 2022-11-04 NOTE — Telephone Encounter (Signed)
Agree need MD review. This doesn't sound like allergies but he's fine to continue taking his Allegra and can use Tylenol for his temp or dextromethorphan-containing product for dry cough.

## 2022-11-04 NOTE — Telephone Encounter (Signed)
Spoke with patient of Dr. Bjorn Pippin -- he reports cough, low-grade fever, night sweats since Friday 5/17 (hospital discharge). He is coughing and it is a dry cough, non-productive. He reports the cough is worse the last hour before going to bed, but does not cough at night. He thinks this is his allergies (takes Careers adviser). Temp has been 99.7 (Monday) but today and yesterday is OK. He reports temp fluctuates. He states he is not worried about this but his wife is  Advised will send to MD to review Advised will send to PharmD team for review/advice of what OTC cough/cold meds he may can safely take

## 2022-11-05 NOTE — Telephone Encounter (Signed)
He did have fever during hospitalization for which workup was negative and we were suspecting was reactive after MI.  Was max temp 99.7 at home?  Would not classify as fever if staying below 100.4.  Should be fine to come in for appointment on Friday

## 2022-11-05 NOTE — Telephone Encounter (Signed)
Spoke to patient, he reports temp was 99.5-99.7, never over 100.  Advised to keep appt tomorrow.  Patient agreed.

## 2022-11-06 ENCOUNTER — Encounter: Payer: Self-pay | Admitting: Cardiology

## 2022-11-06 ENCOUNTER — Ambulatory Visit: Payer: BC Managed Care – PPO | Attending: Cardiology | Admitting: Cardiology

## 2022-11-06 VITALS — BP 112/84 | HR 68 | Ht 73.0 in | Wt 212.2 lb

## 2022-11-06 DIAGNOSIS — D696 Thrombocytopenia, unspecified: Secondary | ICD-10-CM | POA: Diagnosis not present

## 2022-11-06 DIAGNOSIS — I2111 ST elevation (STEMI) myocardial infarction involving right coronary artery: Secondary | ICD-10-CM | POA: Diagnosis not present

## 2022-11-06 DIAGNOSIS — I5021 Acute systolic (congestive) heart failure: Secondary | ICD-10-CM

## 2022-11-06 NOTE — Patient Instructions (Addendum)
Medication Instructions:  Your physician recommends that you continue on your current medications as directed. Please refer to the Current Medication list given to you today.  *If you need a refill on your cardiac medications before your next appointment, please call your pharmacy*   Lab Work: Your physician recommends that you have labs drawn today: CMET/CBC  If you have labs (blood work) drawn today and your tests are completely normal, you will receive your results only by: MyChart Message (if you have MyChart) OR A paper copy in the mail If you have any lab test that is abnormal or we need to change your treatment, we will call you to review the results.   Follow-Up: At [facility], you and your health needs are our priority.  As part of our continuing mission to provide you with exceptional heart care, we have created designated Provider Care Teams.  These Care Teams include your primary Cardiologist (physician) and Advanced Practice Providers (APPs -  Physician Assistants and Nurse Practitioners) who all work together to provide you with the care you need, when you need it.  We recommend signing up for the patient portal called "MyChart".  Sign up information is provided on this After Visit Summary.  MyChart is used to connect with patients for Virtual Visits (Telemedicine).  Patients are able to view lab/test results, encounter notes, upcoming appointments, etc.  Non-urgent messages can be sent to your provider as well.   To learn more about what you can do with MyChart, go to [email or web address].    Your next appointment:   2-3 month(s)  Provider:   Little Ishikawa, MD     Other Instructions You may return to work on November 16, 2022.

## 2022-11-06 NOTE — Progress Notes (Signed)
Cardiology Office Note:    Date:  11/06/2022   ID:  Craig Macias, DOB 1970/09/20, MRN [id number]  PCP:  Craig Has, MD  Cardiologist:  Craig Ishikawa, MD  Electrophysiologist:  None   Referring MD: No ref. provider found   Chief Complaint  Patient presents with   Coronary Artery Disease    History of Present Illness:    Craig Macias is a 52 y.o. male with a hx of CAD status post STEMI 10/2022 with PCI to RPDA, fatty liver disease, chronic thrombocytopenia who presents for follow-up.  Patient developed sudden onset of chest pain on evening of 10/25/2022, reported 10 out of 10 chest pain that lasted for 4 hours.  He did not seek medical attention at that time.  He presented to his PCP's office 5/14 for annual physical and EKG showed ST elevations so he was transported to the ED via EMS. EKG in ED showed normal sinus rhythm with Q waves and mild ST elevation in inferior leads and leads V3-V6.  This was reviewed with interventional cardiology and suspected late presentation of STEMI, so STEMI alert was not activated.  High-sensitivity troponin downtrending 11, 563 >> 11,386.   Echo showed EF 50 to 55%, normal RV function, no significant valvular disease.  Plan was for cath on 5/15, but patient developed acute chest pain around 6 AM on 5/15 and worsening ST elevations on EKG.  STEMI alert activated, found to have occluded RPDA 3, underwent successful PCI with DES.  Also noted to have 35% proximal mid RCA stenosis, 40% RPDA 1, 50% RPDA 2, 50% ramus, 40% D1.  EF appeared 45 to 50% on cath.  Repeat echo 10/29/22 showed EF 40-45%.  Since discharge from the hospital, he reports that he Macias had some mild aching chest pain over the last few days.  States he does not notice it unless he thinks about it.  He denies any shortness of breath, lower extremity edema, or palpitations.  He does report some lightheadedness but denies any syncope.  He had night sweats about a week ago.  He walks daily for 10  to 15 minutes, denies any exertional symptoms.  He is taking DAPT, denies any bleeding issues.   No past medical history on file.  Past Surgical History:  Procedure Laterality Date   CORONARY/GRAFT ACUTE MI REVASCULARIZATION N/A 10/28/2022   Procedure: Coronary/Graft Acute MI Revascularization;  Surgeon: Craig Lex, MD;  Location: [facility] INVASIVE CV LAB;  Service: Cardiovascular;  Laterality: N/A;   LEFT HEART CATH AND CORONARY ANGIOGRAPHY N/A 10/28/2022   Procedure: LEFT HEART CATH AND CORONARY ANGIOGRAPHY;  Surgeon: Craig Lex, MD;  Location[facility] INVASIVE CV LAB;  Service: Cardiovascular;  Laterality: N/A;   VASECTOMY  09/2015    Current Medications: Current Meds  Medication Sig   ASCORBIC ACID PO Take 1 tablet by mouth daily. Vitamin C, unknown strength.   aspirin EC 81 MG tablet Take 1 tablet (81 mg total) by mouth daily. Swallow whole.   atorvastatin (LIPITOR) 80 MG tablet Take 1 tablet (80 mg total) by mouth daily.   fexofenadine (ALLEGRA) 180 MG tablet Take 180 mg by mouth daily.   ibuprofen (ADVIL) 200 MG tablet Take 400 mg by mouth daily as needed for headache or moderate pain.   losartan (COZAAR) 25 MG tablet Take 1 tablet (25 mg total) by mouth daily.   MAGNESIUM OXIDE PO Take 1 tablet by mouth at bedtime.   metoprolol succinate (TOPROL-XL) 25 MG 24  hr tablet Take 1 tablet (25 mg total) by mouth daily.   Multiple Vitamin (MULTIVITAMIN ADULT PO) 1 tablet Orally once a day   nitroGLYCERIN (NITROSTAT) 0.4 MG SL tablet Place 1 tablet (0.4 mg total) under the tongue every 5 (five) minutes x 3 doses as needed for chest pain.   pseudoephedrine (SUDAFED) 30 MG tablet Take 30 mg by mouth 2 (two) times daily as needed for congestion.   ticagrelor (BRILINTA) 90 MG TABS tablet Take 1 tablet (90 mg total) by mouth 2 (two) times daily.     Allergies:   Amoxil [amoxicillin], Biaxin [clarithromycin], and Penicillins   Social History   Socioeconomic History   Marital status:  Unknown    Spouse name: Not on file   Number of children: Not on file   Years of education: Not on file   Highest education level: Not on file  Occupational History   Not on file  Tobacco Use   Smoking status: Some Days   Smokeless tobacco: Former   Tobacco comments:    social  Vaping Use   Vaping Use: Never used  Substance and Sexual Activity   Alcohol use: Yes    Alcohol/week: 1.0 standard drink of alcohol    Types: 1 Cans of beer per week    Comment: occasional   Drug use: Yes    Types: Marijuana    Comment: occasional   Sexual activity: Yes    Birth control/protection: None  Other Topics Concern   Not on file  Social History Narrative   Not on file   Social Determinants of Health   Financial Resource Strain: Low Risk  (10/28/2022)   Overall Financial Resource Strain (CARDIA)    Difficulty of Paying Living Expenses: Not hard at all  Food Insecurity: No Food Insecurity (10/28/2022)   Hunger Vital Sign    Worried About Running Out of Food in the Last Year: Never true    Ran Out of Food in the Last Year: Never true  Transportation Needs: No Transportation Needs (10/28/2022)   PRAPARE - Administrator, Civil Service (Medical): No    Lack of Transportation (Non-Medical): No  Physical Activity: Not on file  Stress: Not on file  Social Connections: Not on file     Family History: The patient's family history includes Cancer in his father; Colitis in his mother; Heart attack in his paternal grandfather; Stroke in his maternal grandfather.  ROS:   Please see the history of present illness.     All other systems reviewed and are negative.  EKGs/Labs/Other Studies Reviewed:    The following studies were reviewed today:   EKG:   11/06/2022: Normal sinus rhythm, rate 68, persistent ST elevations in inferior leads and V3-6; appears unchanged from post PCI EKG on 10/28/2022  Recent Labs: 10/30/2022: ALT 63; BUN 14; Creatinine, Ser 1.03; Hemoglobin 14.1;  Platelets 104; Potassium 3.9; Sodium 134  Recent Lipid Panel    Component Value Date/Time   CHOL 148 10/28/2022 0940   TRIG 49 10/28/2022 0940   HDL 58 10/28/2022 0940   CHOLHDL 2.6 10/28/2022 0940   VLDL 10 10/28/2022 0940   LDLCALC 80 10/28/2022 0940    Physical Exam:    VS:  BP 112/84   Pulse 68   Ht 6\' 1"  (1.854 m)   Wt 212 lb 3.2 oz (96.3 kg)   SpO2 97%   BMI 28.00 kg/m     Wt Readings from Last 3 Encounters:  11/06/22 212  lb 3.2 oz (96.3 kg)  10/27/22 214 lb 1.1 oz (97.1 kg)  12/14/17 205 lb 3.2 oz (93.1 kg)     GEN:  Well nourished, well developed in no acute distress HEENT: Normal NECK: No JVD; No carotid bruits LYMPHATICS: No lymphadenopathy CARDIAC: RRR, no murmurs, rubs, gallops RESPIRATORY:  Clear to auscultation without rales, wheezing or rhonchi  ABDOMEN: Soft, non-tender, non-distended MUSCULOSKELETAL:  No edema; No deformity  SKIN: Warm and dry NEUROLOGIC:  Alert and oriented x 3 PSYCHIATRIC:  Normal affect   ASSESSMENT:    1. ST elevation myocardial infarction involving right coronary artery (HCC)   2. Acute systolic heart failure (HCC)   3. Thrombocytopenia (HCC)    PLAN:    STEMI: Patient developed sudden onset of chest pain on evening of 10/25/2022, reported 10 out of 10 chest pain that lasted for 4 hours.  He presented to his PCP's office 5/14 for annual physical and EKG showed ST elevations so he was transported to the ED via EMS. EKG here shows normal sinus rhythm with Q waves and mild ST elevation in inferior leads and leads V3-V6.  This was reviewed with interventional cardiology and suspected late presentation of STEMI, so STEMI alert was not activated.  High-sensitivity troponin downtrending 11, 563 >> 11,386.   Echo shows EF 50 to 55%, normal RV function, no significant valvular disease.  Plan was for cath on 5/15, but patient developed acute chest pain around 6 AM on 5/15 and worsening ST elevations on EKG.  STEMI alert activated, found to  have occluded RPDA 3, underwent successful PCI with DES.  Also noted to have 35% proximal mid RCA stenosis, 40% RPDA 1, 50% RPDA 2, 50% ramus, 40% D1.  EF appeared 45 to 50% on cath.  Repeat echo 10/29/22 showed EF 40-45% - Continue ASA 81 mg daily, ticagrelor 90 mg twice daily - Started atorvastatin 80 mg daily.  LDL 80, goal <55 - Continue toprol XL 25 mg daily   Acute systolic heart failure: due to MI.  Echo 10/29/22 showed EF 40-45% -Continue toprol XL 25 mg daily -Continue losartan 25 mg daily -Will hold off on additional GDMT at this time, as he Macias been feeling under the weather and would not like to add additional medications at this time -Plan repeat echocardiogram in 3 months   Fever: to 100.80F on 5/16.  No clear source of infection.  CXR unremarkable, UA negative, blood cultures NGTD.  Suspect reactive following MI.   Chronic Thrombocytopenia: history of chronic thrombocytopenia with baseline platelets in the 90s to low 100s.  Macias been stable   Transaminitis: mild, Macias history of fatty liver disease.  Will monitor with starting statin  RTC in 2 months    Medication Adjustments/Labs and Tests Ordered: Current medicines are reviewed at length with the patient today.  Concerns regarding medicines are outlined above.  Orders Placed This Encounter  Procedures   Comprehensive metabolic panel   CBC   EKG 12-Lead   No orders of the defined types were placed in this encounter.   Patient Instructions  Medication Instructions:  Your physician recommends that you continue on your current medications as directed. Please refer to the Current Medication list given to you today.  *If you need a refill on your cardiac medications before your next appointment, please call your pharmacy*   Lab Work: Your physician recommends that you have labs drawn today: CMET/CBC  If you have labs (blood work) drawn today and your tests are  completely normal, you will receive your results only  by: MyChart Message (if you have MyChart) OR A paper copy in the mail If you have any lab test that is abnormal or we need to change your treatment, we will call you to review the results.   Follow-Up: At [facility], you and your health needs are our priority.  As part of our continuing mission to provide you with exceptional heart care, we have created designated Provider Care Teams.  These Care Teams include your primary Cardiologist (physician) and Advanced Practice Providers (APPs -  Physician Assistants and Nurse Practitioners) who all work together to provide you with the care you need, when you need it.  We recommend signing up for the patient portal called "MyChart".  Sign up information is provided on this After Visit Summary.  MyChart is used to connect with patients for Virtual Visits (Telemedicine).  Patients are able to view lab/test results, encounter notes, upcoming appointments, etc.  Non-urgent messages can be sent to your provider as well.   To learn more about what you can do with MyChart, go to [email or web address].    Your next appointment:   2-3 month(s)  Provider:   Little Ishikawa, MD     Other Instructions You may return to work on November 16, 2022.    Signed, Craig Ishikawa, MD  11/06/2022 11:28 AM    [facility]

## 2022-11-07 ENCOUNTER — Telehealth: Payer: Self-pay | Admitting: Physician Assistant

## 2022-11-07 ENCOUNTER — Encounter: Payer: Self-pay | Admitting: Cardiology

## 2022-11-07 DIAGNOSIS — I2111 ST elevation (STEMI) myocardial infarction involving right coronary artery: Secondary | ICD-10-CM

## 2022-11-07 LAB — COMPREHENSIVE METABOLIC PANEL
ALT: 129 IU/L — ABNORMAL HIGH (ref 0–44)
AST: 65 IU/L — ABNORMAL HIGH (ref 0–40)
Albumin/Globulin Ratio: 1.5 (ref 1.2–2.2)
Albumin: 4.5 g/dL (ref 4.1–5.1)
Alkaline Phosphatase: 166 IU/L — ABNORMAL HIGH (ref 44–121)
BUN/Creatinine Ratio: 18 (ref 9–20)
BUN: 17 mg/dL (ref 6–24)
Bilirubin Total: 0.7 mg/dL (ref 0.0–1.2)
CO2: 25 mmol/L (ref 20–29)
Calcium: 9.5 mg/dL (ref 8.7–10.2)
Chloride: 100 mmol/L (ref 96–106)
Creatinine, Ser: 0.95 mg/dL (ref 0.76–1.27)
Globulin, Total: 3.1 g/dL (ref 1.5–4.5)
Glucose: 92 mg/dL (ref 70–99)
Potassium: 4.6 mmol/L (ref 3.5–5.2)
Sodium: 139 mmol/L (ref 134–144)
Total Protein: 7.6 g/dL (ref 6.0–8.5)
eGFR: 98 mL/min/{1.73_m2} (ref >59)

## 2022-11-07 LAB — CBC
Hematocrit: 40.2 % (ref 37.5–51.0)
Hemoglobin: 14.2 g/dL (ref 13.0–17.7)
MCH: 33.9 pg — ABNORMAL HIGH (ref 26.6–33.0)
MCHC: 35.3 g/dL (ref 31.5–35.7)
MCV: 96 fL (ref 79–97)
Platelets: 225 10*3/uL (ref 150–450)
RBC: 4.19 x10E6/uL (ref 4.14–5.80)
RDW: 12.3 % (ref 11.6–15.4)
WBC: 3.8 10*3/uL (ref 3.4–10.8)

## 2022-11-07 NOTE — Telephone Encounter (Signed)
Received call from patient regarding labs. LFTs elevated since starting lipitor. Appears they are in the order of prior elevation 4 years ago, has not followed with GI for this.   I asked him to stop taking lipitor until Dr. Bjorn Pippin can review. I also suggested a lipid clinic referral  - I have place this - for PCSK9i.    Marcelino Duster, PA-C 11/07/2022, 11:58 AM [phone] [facility]

## 2022-11-10 ENCOUNTER — Ambulatory Visit: Payer: BC Managed Care – PPO | Attending: Cardiovascular Disease | Admitting: Pharmacist

## 2022-11-10 ENCOUNTER — Telehealth: Payer: Self-pay | Admitting: Pharmacist

## 2022-11-10 ENCOUNTER — Other Ambulatory Visit: Payer: Self-pay | Admitting: *Deleted

## 2022-11-10 ENCOUNTER — Telehealth: Payer: Self-pay

## 2022-11-10 ENCOUNTER — Other Ambulatory Visit (HOSPITAL_COMMUNITY): Payer: Self-pay

## 2022-11-10 ENCOUNTER — Encounter: Payer: Self-pay | Admitting: Pharmacist

## 2022-11-10 DIAGNOSIS — Z789 Other specified health status: Secondary | ICD-10-CM | POA: Diagnosis not present

## 2022-11-10 DIAGNOSIS — R7989 Other specified abnormal findings of blood chemistry: Secondary | ICD-10-CM

## 2022-11-10 DIAGNOSIS — E785 Hyperlipidemia, unspecified: Secondary | ICD-10-CM

## 2022-11-10 DIAGNOSIS — I213 ST elevation (STEMI) myocardial infarction of unspecified site: Secondary | ICD-10-CM | POA: Diagnosis not present

## 2022-11-10 DIAGNOSIS — I2111 ST elevation (STEMI) myocardial infarction involving right coronary artery: Secondary | ICD-10-CM

## 2022-11-10 DIAGNOSIS — T466X5A Adverse effect of antihyperlipidemic and antiarteriosclerotic drugs, initial encounter: Secondary | ICD-10-CM

## 2022-11-10 NOTE — Telephone Encounter (Signed)
Please complete PA for repatha 

## 2022-11-10 NOTE — Telephone Encounter (Signed)
Pharmacy Patient Advocate Encounter  Prior Authorization for REPATHA 140MG /ML has been approved by [employer] [employer] (ins).    KEY # [id number]  Effective dates: 5.28.24 through 2.28.25

## 2022-11-10 NOTE — Telephone Encounter (Signed)
Pharmacy Patient Advocate Encounter   Received notification from [facility] that prior authorization for REPATHA 140MG /ML is required/requested.   PA submitted on 5.28.24 to (ins) [employer] via CoverMyMeds Key or (Medicaid) confirmation # [id number]   Status is pending

## 2022-11-10 NOTE — Patient Instructions (Addendum)
It was nice meeting you today  We would like your LDL (bad cholesterol) to be less than 55  We would like to start a new medication called Repatha which you would inject once every 2 weeks  I will complete the prior authorization for you and contact your when it is approved  Once your start the medication we will recheck your cholesterol in about 2-3 months  Please message with any questions  Laural Golden, PharmD, BCACP, CDCES, CPP [address] [place], [place], [address] Phone: [phone], Fax: [phone]

## 2022-11-10 NOTE — Telephone Encounter (Signed)
Pharmacy Patient Advocate Encounter  Prior Authorization for REPATHA 140MG/ML has been approved by [facility] (ins).    KEY # [id number]  Effective dates: 5.28.24 through 2.28.25 

## 2022-11-10 NOTE — Progress Notes (Signed)
Patient ID: [name]                 DOB: Apr 09, 1971                    MRN: [id number]     HPI: Craig Macias is a 52 y.o. male patient referred to lipid clinic by Dr Craig Macias. PMH is significant for STEMI, CHF, and intolerance to statins (elevated LFTs).   Patient presents today with wife. Sent to ER on 10/27/22 after PCP found ST segment changes on EKG. Patient was complaining of heartburn and malaise. Diagnosed with late presenting STEMI and admitted.  Underwent PCI with DES and discharged on atorvastatin. CMP showed elevated LFTs so statin was discontinued and patient referred to lipid clinic.  Wife has been helping him improve his diet. She is cutting down on his junk food, salt, and sugars. They are both high school math teachers.   Current Medications:  N/A  Intolerances:  Atorvastatin  Risk Factors:  STEMI CAD  LDL goal: <55  Labs: TC 148, Trigs 49, HDL 58, LDL 80, LPA 75.2 (1/91/4782)   No past medical history on file.  Current Outpatient Medications on File Prior to Visit  Medication Sig Dispense Refill   ASCORBIC ACID PO Take 1 tablet by mouth daily. Vitamin C, unknown strength.     aspirin EC 81 MG tablet Take 1 tablet (81 mg total) by mouth daily. Swallow whole. 90 tablet 12   atorvastatin (LIPITOR) 80 MG tablet Take 1 tablet (80 mg total) by mouth daily. 90 tablet 3   fexofenadine (ALLEGRA) 180 MG tablet Take 180 mg by mouth daily.     ibuprofen (ADVIL) 200 MG tablet Take 400 mg by mouth daily as needed for headache or moderate pain.     losartan (COZAAR) 25 MG tablet Take 1 tablet (25 mg total) by mouth daily. 90 tablet 3   MAGNESIUM OXIDE PO Take 1 tablet by mouth at bedtime.     metoprolol succinate (TOPROL-XL) 25 MG 24 hr tablet Take 1 tablet (25 mg total) by mouth daily. 90 tablet 3   Multiple Vitamin (MULTIVITAMIN ADULT PO) 1 tablet Orally once a day     nitroGLYCERIN (NITROSTAT) 0.4 MG SL tablet Place 1 tablet (0.4 mg total) under the tongue every 5  (five) minutes x 3 doses as needed for chest pain. 25 tablet 1   pseudoephedrine (SUDAFED) 30 MG tablet Take 30 mg by mouth 2 (two) times daily as needed for congestion.     ticagrelor (BRILINTA) 90 MG TABS tablet Take 1 tablet (90 mg total) by mouth 2 (two) times daily. 60 tablet 11   No current facility-administered medications on file prior to visit.    Allergies  Allergen Reactions   Amoxil [Amoxicillin] Swelling and Rash   Biaxin [Clarithromycin] Swelling   Penicillins Swelling and Rash    Assessment/Plan:  1. Hyperlipidemia - Patient most recent LDL 80 which is above goal of <55. Discontinued atorvastatin due to elevated LFTs. Added atorvastatin to medication intolerance list.  Discussed next options with patient and recommended PCSK9i. Using demo pen, educated patient on mechanism of action, storage, site selection, administration, an possible side effects. Patient able to demonstrate in room. Will complete PA and contact patient when approved. Gave copay care and also gave copay card for Brilinta. Will recheck cholesterol in 2-3 months.  D/C atorvastatin Start Repatha q 2 weeks Check lipid panel in 2-3 months  Laural Golden, PharmD, [place],  CDCES, CPP [address] [place], [place], [address] Phone: [phone], Fax: [phone]

## 2022-11-11 MED ORDER — METOPROLOL SUCCINATE ER 25 MG PO TB24
25.0000 mg | ORAL_TABLET | Freq: Every day | ORAL | 1 refills | Status: DC
Start: 2022-11-11 — End: 2023-07-21

## 2022-11-11 MED ORDER — REPATHA SURECLICK 140 MG/ML ~~LOC~~ SOAJ
1.0000 mL | SUBCUTANEOUS | 1 refills | Status: DC
Start: 2022-11-11 — End: 2023-04-26

## 2022-11-11 MED ORDER — LOSARTAN POTASSIUM 25 MG PO TABS
25.0000 mg | ORAL_TABLET | Freq: Every day | ORAL | 1 refills | Status: DC
Start: 2022-11-11 — End: 2023-07-21

## 2022-11-11 MED ORDER — TICAGRELOR 90 MG PO TABS
90.0000 mg | ORAL_TABLET | Freq: Two times a day (BID) | ORAL | 1 refills | Status: DC
Start: 2022-11-11 — End: 2023-06-01

## 2022-11-11 MED ORDER — ASPIRIN 81 MG PO TBEC
81.0000 mg | DELAYED_RELEASE_TABLET | Freq: Every day | ORAL | 1 refills | Status: DC
Start: 2022-11-11 — End: 2023-12-15

## 2022-11-12 ENCOUNTER — Telehealth (HOSPITAL_COMMUNITY): Payer: Self-pay

## 2022-11-12 NOTE — Telephone Encounter (Signed)
Pt called and stated he is interested in CR. Patient will come in for orientation on 11/17/22 @ 8AM and will attend the 8:15AM exercise class.   Pensions consultant.

## 2022-11-13 ENCOUNTER — Telehealth (HOSPITAL_COMMUNITY): Payer: Self-pay

## 2022-11-13 NOTE — Telephone Encounter (Signed)
Reviewed with patient the Cardiac Rehab Cardiac Risk [place] Nursing Assessment. Patient knows where our office is located, and to wear comfortable clothing/shoes. Recommended to patient to eat, take medications, and if diabetic, to check blood sugar before coming to classes. Explained orientation may take 2 hours.  

## 2022-11-17 ENCOUNTER — Encounter (HOSPITAL_COMMUNITY): Payer: Self-pay

## 2022-11-17 ENCOUNTER — Encounter (HOSPITAL_COMMUNITY)
Admission: RE | Admit: 2022-11-17 | Discharge: 2022-11-17 | Disposition: A | Discharge status: Home / Self Care | Payer: BC Managed Care – PPO | Source: Ambulatory Visit | Attending: Cardiology | Admitting: Cardiology

## 2022-11-17 ENCOUNTER — Other Ambulatory Visit (HOSPITAL_BASED_OUTPATIENT_CLINIC_OR_DEPARTMENT_OTHER): Payer: Self-pay

## 2022-11-17 VITALS — BP 90/70 | HR 81 | Ht 73.0 in | Wt 213.0 lb

## 2022-11-17 DIAGNOSIS — Z955 Presence of coronary angioplasty implant and graft: Secondary | ICD-10-CM

## 2022-11-17 DIAGNOSIS — I2111 ST elevation (STEMI) myocardial infarction involving right coronary artery: Secondary | ICD-10-CM

## 2022-11-17 HISTORY — DX: Hyperlipidemia, unspecified: E78.5

## 2022-11-17 HISTORY — DX: Atherosclerotic heart disease of native coronary artery without angina pectoris: I25.10

## 2022-11-17 HISTORY — DX: Essential (primary) hypertension: I10

## 2022-11-17 NOTE — Progress Notes (Signed)
EKG from 11/06/22 compared with cardiac rehab EKG on 11/17/22. Handed to Stan Head, NP for review. No new orders/changes.

## 2022-11-17 NOTE — Progress Notes (Signed)
Cardiac Individual Treatment Plan  Patient Details  Name: Craig Macias MRN: [id number] Date of Birth: 08/23/1970 Referring Provider:   Flowsheet Row INTENSIVE CARDIAC REHAB ORIENT from 11/17/2022 in [facility]  Referring Provider Epifanio Lesches, MD       Initial Encounter Date:  Flowsheet Row INTENSIVE CARDIAC REHAB ORIENT from 11/17/2022 in [facility] for Heart, Vascular, & Lung Health  Date 11/17/22       Visit Diagnosis: 10/27/22 STEMI  10/28/22 Sp DES RPDA  Patient's Home Medications on Admission:  Current Outpatient Medications:    ASCORBIC ACID PO, Take 1 tablet by mouth daily. Vitamin C, unknown strength., Disp: , Rfl:    aspirin EC 81 MG tablet, Take 1 tablet (81 mg total) by mouth daily. Swallow whole., Disp: 90 tablet, Rfl: 1   Evolocumab (REPATHA SURECLICK) 140 MG/ML SOAJ, Inject 140 mg into the skin every 14 (fourteen) days., Disp: 6 mL, Rfl: 1   fexofenadine (ALLEGRA) 180 MG tablet, Take 180 mg by mouth daily., Disp: , Rfl:    losartan (COZAAR) 25 MG tablet, Take 1 tablet (25 mg total) by mouth daily., Disp: 90 tablet, Rfl: 1   MAGNESIUM OXIDE PO, Take 1 tablet by mouth at bedtime., Disp: , Rfl:    metoprolol succinate (TOPROL-XL) 25 MG 24 hr tablet, Take 1 tablet (25 mg total) by mouth daily., Disp: 90 tablet, Rfl: 1   Multiple Vitamin (MULTIVITAMIN ADULT PO), 1 tablet Orally once a day, Disp: , Rfl:    nitroGLYCERIN (NITROSTAT) 0.4 MG SL tablet, Place 1 tablet (0.4 mg total) under the tongue every 5 (five) minutes x 3 doses as needed for chest pain., Disp: 25 tablet, Rfl: 1   ticagrelor (BRILINTA) 90 MG TABS tablet, Take 1 tablet (90 mg total) by mouth 2 (two) times daily., Disp: 180 tablet, Rfl: 1   pseudoephedrine (SUDAFED) 30 MG tablet, Take 30 mg by mouth 2 (two) times daily as needed for congestion. (Patient not taking: Reported on 11/17/2022), Disp: , Rfl:   Past Medical  History: Past Medical History:  Diagnosis Date   Coronary artery disease    Hyperlipidemia    Hypertension     Tobacco Use: Social History   Tobacco Use  Smoking Status Some Days  Smokeless Tobacco Former  Tobacco Comments   social    Labs: Review Flowsheet       Latest Ref Rng & Units 10/28/2022  Labs for ITP Cardiac and Pulmonary Rehab  Cholestrol 0 - 200 mg/dL 782   LDL (calc) 0 - 99 mg/dL 80   HDL-C >95 mg/dL 58   Trlycerides <621 mg/dL 49   Hemoglobin H0Q 4.8 - 5.6 % 4.8     Capillary Blood Glucose: No results found for: "GLUCAP"   Exercise Target Goals: Exercise Program Goal: Individual exercise prescription set using results from initial 6 min walk test and THRR while considering  patient's activity barriers and safety.   Exercise Prescription Goal: Initial exercise prescription builds to 30-45 minutes a day of aerobic activity, 2-3 days per week.  Home exercise guidelines will be given to patient during program as part of exercise prescription that the participant will acknowledge.  Activity Barriers & Risk Stratification:  Activity Barriers & Cardiac Risk Stratification - 11/17/22 1009       Activity Barriers & Cardiac Risk Stratification   Activity Barriers Deconditioning;Shortness of Breath    Cardiac Risk Stratification High  6 Minute Walk:  6 Minute Walk     Row Name 11/17/22 0930         6 Minute Walk   Phase Initial     Distance 1466 feet     Walk Time 6 minutes     # of Rest Breaks 0     MPH 2.8     METS 4.11     RPE 9     Perceived Dyspnea  0     VO2 Peak 14.4     Symptoms No     Resting HR 81 bpm     Resting BP 90/70     Resting Oxygen Saturation  100 %     Exercise Oxygen Saturation  during 6 min walk 98 %     Max Ex. HR 85 bpm     Max Ex. BP 110/70     2 Minute Post BP 100/60              Oxygen Initial Assessment:   Oxygen Re-Evaluation:   Oxygen Discharge (Final Oxygen  Re-Evaluation):   Initial Exercise Prescription:  Initial Exercise Prescription - 11/17/22 1000       Date of Initial Exercise RX and Referring Provider   Date 11/17/22    Referring Provider Epifanio Lesches, MD    Expected Discharge Date 01/29/23      Treadmill   MPH 3.2    Grade 0    Minutes 15    METs 3.45      Recumbant Elliptical   Level 2    RPM 60    Watts 25    Minutes 15    METs 4.1      Prescription Details   Frequency (times per week) 3    Duration Progress to 30 minutes of continuous aerobic without signs/symptoms of physical distress      Intensity   THRR 40-80% of Max Heartrate 68-137    Ratings of Perceived Exertion 11-13    Perceived Dyspnea 0-4      Progression   Progression Continue progressive overload as per policy without signs/symptoms or physical distress.      Resistance Training   Training Prescription Yes    Weight 5 lbs    Reps 10-15             Perform Capillary Blood Glucose checks as needed.  Exercise Prescription Changes:   Exercise Comments:   Exercise Goals and Review:   Exercise Goals     Row Name 11/17/22 1012             Exercise Goals   Increase Physical Activity Yes       Intervention Provide advice, education, support and counseling about physical activity/exercise needs.;Develop an individualized exercise prescription for aerobic and resistive training based on initial evaluation findings, risk stratification, comorbidities and participant's personal goals.       Expected Outcomes Short Term: Attend rehab on a regular basis to increase amount of physical activity.;Long Term: Add in home exercise to make exercise part of routine and to increase amount of physical activity.;Long Term: Exercising regularly at least 3-5 days a week.       Increase Strength and Stamina Yes       Intervention Provide advice, education, support and counseling about physical activity/exercise needs.;Develop an individualized  exercise prescription for aerobic and resistive training based on initial evaluation findings, risk stratification, comorbidities and participant's personal goals.       Expected Outcomes Short Term:  Increase workloads from initial exercise prescription for resistance, speed, and METs.;Short Term: Perform resistance training exercises routinely during rehab and add in resistance training at home;Long Term: Improve cardiorespiratory fitness, muscular endurance and strength as measured by increased METs and functional capacity ( )       Able to understand and use rate of perceived exertion (RPE) scale Yes       Intervention Provide education and explanation on how to use RPE scale       Expected Outcomes Short Term: Able to use RPE daily in rehab to express subjective intensity level;Long Term:  Able to use RPE to guide intensity level when exercising independently       Knowledge and understanding of Target Heart Rate Range (THRR) Yes       Intervention Provide education and explanation of THRR including how the numbers were predicted and where they are located for reference       Expected Outcomes Short Term: Able to state/look up THRR;Short Term: Able to use daily as guideline for intensity in rehab;Long Term: Able to use THRR to govern intensity when exercising independently       Understanding of Exercise Prescription Yes       Intervention Provide education, explanation, and written materials on patient's individual exercise prescription       Expected Outcomes Short Term: Able to explain program exercise prescription;Long Term: Able to explain home exercise prescription to exercise independently                Exercise Goals Re-Evaluation :   Discharge Exercise Prescription (Final Exercise Prescription Changes):   Nutrition:  Target Goals: Understanding of nutrition guidelines, daily intake of sodium 1500mg , cholesterol 200mg , calories 30% from fat and 7% or less from saturated  fats, daily to have 5 or more servings of fruits and vegetables.  Biometrics:  Pre Biometrics - 11/17/22 0818       Pre Biometrics   Waist Circumference 38 inches    Hip Circumference 42.5 inches    Waist to Hip Ratio 0.89 %    Triceps Skinfold 12 mm    % Body Fat 24.7 %    Grip Strength 38 kg    Flexibility 0 in   Pt could not reach   Single Leg Stand 30 seconds              Nutrition Therapy Plan and Nutrition Goals:   Nutrition Assessments:  MEDIFICTS Score Key: ?70 Need to make dietary changes  40-70 Heart Healthy Diet ? 40 Therapeutic Level Cholesterol Diet    Picture Your Plate Scores: <86 Unhealthy dietary pattern with much room for improvement. 41-50 Dietary pattern unlikely to meet recommendations for good health and room for improvement. 51-60 More healthful dietary pattern, with some room for improvement.  >60 Healthy dietary pattern, although there may be some specific behaviors that could be improved.    Nutrition Goals Re-Evaluation:   Nutrition Goals Re-Evaluation:   Nutrition Goals Discharge (Final Nutrition Goals Re-Evaluation):   Psychosocial: Target Goals: Acknowledge presence or absence of significant depression and/or stress, maximize coping skills, provide positive support system. Participant is able to verbalize types and ability to use techniques and skills needed for reducing stress and depression.  Initial Review & Psychosocial Screening:  Initial Psych Review & Screening - 11/17/22 1137       Initial Review   Current issues with --             Quality of Life Scores:  Quality of Life - 11/17/22 1147       Quality of Life Scores   Health/Function Pre 16.73 %    Socioeconomic Pre 20.93 %    Psych/Spiritual Pre 11.14 %    Family Pre 20.4 %    GLOBAL Pre 16.99 %            Scores of 19 and below usually indicate a poorer quality of life in these areas.  A difference of  2-3 points is a clinically meaningful  difference.  A difference of 2-3 points in the total score of the Quality of Life Index has been associated with significant improvement in overall quality of life, self-image, physical symptoms, and general health in studies assessing change in quality of life.  PHQ-9: Review Flowsheet       11/17/2022  Depression screen PHQ 2/9  Decreased Interest 1  Down, Depressed, Hopeless 1  PHQ - 2 Score 2  Altered sleeping 1  Tired, decreased energy 2  Change in appetite 0  Feeling bad or failure about yourself  1  Trouble concentrating 0  Moving slowly or fidgety/restless 0  Suicidal thoughts 0  PHQ-9 Score 6  Difficult doing work/chores Not difficult at all   Interpretation of Total Score  Total Score Depression Severity:  1-4 = Minimal depression, 5-9 = Mild depression, 10-14 = Moderate depression, 15-19 = Moderately severe depression, 20-27 = Severe depression   Psychosocial Evaluation and Intervention:   Psychosocial Re-Evaluation:   Psychosocial Discharge (Final Psychosocial Re-Evaluation):   Vocational Rehabilitation: Provide vocational rehab assistance to qualifying candidates.   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            Education: Education Goals: Education classes will be provided on a weekly basis, covering required topics. Participant will state understanding/return demonstration of topics presented.     Core Videos: Exercise    Move It!  Clinical staff conducted group or individual video education with verbal and written material and guidebook.  Patient learns the recommended Pritikin exercise program. Exercise with the goal of living a long, healthy life. Some of the health benefits of exercise include controlled  diabetes, healthier blood pressure levels, improved cholesterol levels, improved heart and lung capacity, improved sleep, and better body composition. Everyone should speak with their doctor before starting or changing an exercise routine.  Biomechanical Limitations Clinical staff conducted group or individual video education with verbal and written material and guidebook.  Patient learns how biomechanical limitations can impact exercise and how we can mitigate and possibly overcome limitations to have an impactful and balanced exercise routine.  Body Composition Clinical staff conducted group or individual video education with verbal and written material and guidebook.  Patient learns that body composition (ratio of muscle mass to fat mass) is a key component to assessing overall fitness, rather than body weight alone. Increased fat mass, especially visceral belly fat, can put [place] at increased risk for metabolic syndrome, type 2 diabetes, heart disease, and even death. It is recommended to combine diet and exercise (cardiovascular and resistance training) to improve your body composition. Seek guidance from your physician and exercise physiologist before implementing an exercise routine.  Exercise Action Plan Clinical staff conducted group or individual video education with verbal  and written material and guidebook.  Patient learns the recommended strategies to achieve and enjoy long-term exercise adherence, including variety, self-motivation, self-efficacy, and positive decision making. Benefits of exercise include fitness, good health, weight management, more energy, better sleep, less stress, and overall well-being.  Medical   Heart Disease Risk Reduction Clinical staff conducted group or individual video education with verbal and written material and guidebook.  Patient learns our heart is our most vital organ as it circulates oxygen, nutrients, white blood cells, and hormones throughout the  entire body, and carries waste away. Data supports a plant-based eating plan like the Pritikin Program for its effectiveness in slowing progression of and reversing heart disease. The video provides a number of recommendations to address heart disease.   Metabolic Syndrome and Belly Fat  Clinical staff conducted group or individual video education with verbal and written material and guidebook.  Patient learns what metabolic syndrome is, how it leads to heart disease, and how one can reverse it and keep it from coming back. You have metabolic syndrome if you have 3 of the following 5 criteria: abdominal obesity, high blood pressure, high triglycerides, low HDL cholesterol, and high blood sugar.  Hypertension and Heart Disease Clinical staff conducted group or individual video education with verbal and written material and guidebook.  Patient learns that high blood pressure, or hypertension, is very common in the [place]. Hypertension is largely due to excessive salt intake, but other important risk factors include being overweight, physical inactivity, drinking too much alcohol, smoking, and not eating enough potassium from fruits and vegetables. High blood pressure is a leading risk factor for heart attack, stroke, congestive heart failure, dementia, kidney failure, and premature death. Long-term effects of excessive salt intake include stiffening of the arteries and thickening of heart muscle and organ damage. Recommendations include ways to reduce hypertension and the risk of heart disease.  Diseases of Our Time - Focusing on Diabetes Clinical staff conducted group or individual video education with verbal and written material and guidebook.  Patient learns why the best way to stop diseases of our time is prevention, through food and other lifestyle changes. Medicine (such as prescription pills and surgeries) is often only a Band-Aid on the problem, not a long-term solution. Most common diseases  of our time include obesity, type 2 diabetes, hypertension, heart disease, and cancer. The Pritikin Program is recommended and has been proven to help reduce, reverse, and/or prevent the damaging effects of metabolic syndrome.  Nutrition   Overview of the Pritikin Eating Plan  Clinical staff conducted group or individual video education with verbal and written material and guidebook.  Patient learns about the Pritikin Eating Plan for disease risk reduction. The Pritikin Eating Plan emphasizes a wide variety of unrefined, minimally-processed carbohydrates, like fruits, vegetables, whole grains, and legumes. Go, Caution, and Stop food choices are explained. Plant-based and lean animal proteins are emphasized. Rationale provided for low sodium intake for blood pressure control, low added sugars for blood sugar stabilization, and low added fats and oils for coronary artery disease risk reduction and weight management.  Calorie Density  Clinical staff conducted group or individual video education with verbal and written material and guidebook.  Patient learns about calorie density and how it impacts the Pritikin Eating Plan. Knowing the characteristics of the food you choose will help you decide whether those foods will lead to weight gain or weight loss, and whether you want to consume more or less of them. Weight loss is usually  a side effect of the Pritikin Eating Plan because of its focus on low calorie-dense foods.  Label Reading  Clinical staff conducted group or individual video education with verbal and written material and guidebook.  Patient learns about the Pritikin recommended label reading guidelines and corresponding recommendations regarding calorie density, added sugars, sodium content, and whole grains.  Dining Out - Part 1  Clinical staff conducted group or individual video education with verbal and written material and guidebook.  Patient learns that restaurant meals can be sabotaging  because they can be so high in calories, fat, sodium, and/or sugar. Patient learns recommended strategies on how to positively address this and avoid unhealthy pitfalls.  Facts on Fats  Clinical staff conducted group or individual video education with verbal and written material and guidebook.  Patient learns that lifestyle modifications can be just as effective, if not more so, as many medications for lowering your risk of heart disease. A Pritikin lifestyle can help to reduce your risk of inflammation and atherosclerosis (cholesterol build-up, or plaque, in the artery walls). Lifestyle interventions such as dietary choices and physical activity address the cause of atherosclerosis. A review of the types of fats and their impact on blood cholesterol levels, along with dietary recommendations to reduce fat intake is also included.  Nutrition Action Plan  Clinical staff conducted group or individual video education with verbal and written material and guidebook.  Patient learns how to incorporate Pritikin recommendations into their lifestyle. Recommendations include planning and keeping personal health goals in mind as an important part of their success.  Healthy Mind-Set    Healthy Minds, Bodies, Hearts  Clinical staff conducted group or individual video education with verbal and written material and guidebook.  Patient learns how to identify when they are stressed. Video will discuss the impact of that stress, as well as the many benefits of stress management. Patient will also be introduced to stress management techniques. The way we think, act, and feel has an impact on our hearts.  How Our Thoughts Can Heal Our Hearts  Clinical staff conducted group or individual video education with verbal and written material and guidebook.  Patient learns that negative thoughts can cause depression and anxiety. This can result in negative lifestyle behavior and serious health problems. Cognitive behavioral  therapy is an effective method to help control our thoughts in order to change and improve our emotional outlook.  Additional Videos:  Exercise    Improving Performance  Clinical staff conducted group or individual video education with verbal and written material and guidebook.  Patient learns to use a non-linear approach by alternating intensity levels and lengths of time spent exercising to help burn more calories and lose more body fat. Cardiovascular exercise helps improve heart health, metabolism, hormonal balance, blood sugar control, and recovery from fatigue. Resistance training improves strength, endurance, balance, coordination, reaction time, metabolism, and muscle mass. Flexibility exercise improves circulation, posture, and balance. Seek guidance from your physician and exercise physiologist before implementing an exercise routine and learn your capabilities and proper form for all exercise.  Introduction to Yoga  Clinical staff conducted group or individual video education with verbal and written material and guidebook.  Patient learns about yoga, a discipline of the coming together of mind, breath, and body. The benefits of yoga include improved flexibility, improved range of motion, better posture and core strength, increased lung function, weight loss, and positive self-image. Yoga's heart health benefits include lowered blood pressure, healthier heart rate, decreased cholesterol and triglyceride  levels, improved immune function, and reduced stress. Seek guidance from your physician and exercise physiologist before implementing an exercise routine and learn your capabilities and proper form for all exercise.  Medical   Aging: Enhancing Your Quality of Life  Clinical staff conducted group or individual video education with verbal and written material and guidebook.  Patient learns key strategies and recommendations to stay in good physical health and enhance quality of life, such as  prevention strategies, having an advocate, securing a Health Care Proxy and Power of Attorney, and keeping a list of medications and system for tracking them. It also discusses how to avoid risk for bone loss.  Biology of Weight Control  Clinical staff conducted group or individual video education with verbal and written material and guidebook.  Patient learns that weight gain occurs because we consume more calories than we burn (eating more, moving less). Even if your body weight is normal, you may have higher ratios of fat compared to muscle mass. Too much body fat puts you at increased risk for cardiovascular disease, heart attack, stroke, type 2 diabetes, and obesity-related cancers. In addition to exercise, following the Pritikin Eating Plan can help reduce your risk.  Decoding Lab Results  Clinical staff conducted group or individual video education with verbal and written material and guidebook.  Patient learns that lab test reflects one measurement whose values change over time and are influenced by many factors, including medication, stress, sleep, exercise, food, hydration, pre-existing medical conditions, and more. It is recommended to use the knowledge from this video to become more involved with your lab results and evaluate your numbers to speak with your doctor.   Diseases of Our Time - Overview  Clinical staff conducted group or individual video education with verbal and written material and guidebook.  Patient learns that according to the CDC, 50% to 70% of chronic diseases (such as obesity, type 2 diabetes, elevated lipids, hypertension, and heart disease) are avoidable through lifestyle improvements including healthier food choices, listening to satiety cues, and increased physical activity.  Sleep Disorders Clinical staff conducted group or individual video education with verbal and written material and guidebook.  Patient learns how good quality and duration of sleep are  important to overall health and well-being. Patient also learns about sleep disorders and how they impact health along with recommendations to address them, including discussing with a physician.  Nutrition  Dining Out - Part 2 Clinical staff conducted group or individual video education with verbal and written material and guidebook.  Patient learns how to plan ahead and communicate in order to maximize their dining experience in a healthy and nutritious manner. Included are recommended food choices based on the type of restaurant the patient is visiting.   Fueling a Banker conducted group or individual video education with verbal and written material and guidebook.  There is a strong connection between our food choices and our health. Diseases like obesity and type 2 diabetes are very prevalent and are in large-part due to lifestyle choices. The Pritikin Eating Plan provides plenty of food and hunger-curbing satisfaction. It is easy to follow, affordable, and helps reduce health risks.  Menu Workshop  Clinical staff conducted group or individual video education with verbal and written material and guidebook.  Patient learns that restaurant meals can sabotage health goals because they are often packed with calories, fat, sodium, and sugar. Recommendations include strategies to plan ahead and to communicate with the manager, chef, or server to  help order a healthier meal.  Planning Your Eating Strategy  Clinical staff conducted group or individual video education with verbal and written material and guidebook.  Patient learns about the Pritikin Eating Plan and its benefit of reducing the risk of disease. The Pritikin Eating Plan does not focus on calories. Instead, it emphasizes high-quality, nutrient-rich foods. By knowing the characteristics of the foods, we choose, we can determine their calorie density and make informed decisions.  Targeting Your Nutrition Priorities   Clinical staff conducted group or individual video education with verbal and written material and guidebook.  Patient learns that lifestyle habits have a tremendous impact on disease risk and progression. This video provides eating and physical activity recommendations based on your personal health goals, such as reducing LDL cholesterol, losing weight, preventing or controlling type 2 diabetes, and reducing high blood pressure.  Vitamins and Minerals  Clinical staff conducted group or individual video education with verbal and written material and guidebook.  Patient learns different ways to obtain key vitamins and minerals, including through a recommended healthy diet. It is important to discuss all supplements you take with your doctor.   Healthy Mind-Set    Smoking Cessation  Clinical staff conducted group or individual video education with verbal and written material and guidebook.  Patient learns that cigarette smoking and tobacco addiction pose a serious health risk which affects millions of people. Stopping smoking will significantly reduce the risk of heart disease, lung disease, and many forms of cancer. Recommended strategies for quitting are covered, including working with your doctor to develop a successful plan.  Culinary   Becoming a Set designer conducted group or individual video education with verbal and written material and guidebook.  Patient learns that cooking at home can be healthy, cost-effective, quick, and puts them in control. Keys to cooking healthy recipes will include looking at your recipe, assessing your equipment needs, planning ahead, making it simple, choosing cost-effective seasonal ingredients, and limiting the use of added fats, salts, and sugars.  Cooking - Breakfast and Snacks  Clinical staff conducted group or individual video education with verbal and written material and guidebook.  Patient learns how important breakfast is to satiety  and nutrition through the entire day. Recommendations include key foods to eat during breakfast to help stabilize blood sugar levels and to prevent overeating at meals later in the day. Planning ahead is also a key component.  Cooking - Educational psychologist conducted group or individual video education with verbal and written material and guidebook.  Patient learns eating strategies to improve overall health, including an approach to cook more at home. Recommendations include thinking of animal protein as a side on your plate rather than center stage and focusing instead on lower calorie dense options like vegetables, fruits, whole grains, and plant-based proteins, such as beans. Making sauces in large quantities to freeze for later and leaving the skin on your vegetables are also recommended to maximize your experience.  Cooking - Healthy Salads and Dressing Clinical staff conducted group or individual video education with verbal and written material and guidebook.  Patient learns that vegetables, fruits, whole grains, and legumes are the foundations of the Pritikin Eating Plan. Recommendations include how to incorporate each of these in flavorful and healthy salads, and how to create homemade salad dressings. Proper handling of ingredients is also covered. Cooking - Soups and [employer] - Soups and Desserts Clinical staff conducted group or individual video education with  verbal and written material and guidebook.  Patient learns that Pritikin soups and desserts make for easy, nutritious, and delicious snacks and meal components that are low in sodium, fat, sugar, and calorie density, while high in vitamins, minerals, and filling fiber. Recommendations include simple and healthy ideas for soups and desserts.   Overview     The Pritikin Solution Program Overview Clinical staff conducted group or individual video education with verbal and written material and guidebook.  Patient  learns that the results of the Pritikin Program have been documented in more than 100 articles published in peer-reviewed journals, and the benefits include reducing risk factors for (and, in some cases, even reversing) high cholesterol, high blood pressure, type 2 diabetes, obesity, and more! An overview of the three key pillars of the Pritikin Program will be covered: eating well, doing regular exercise, and having a healthy mind-set.  WORKSHOPS  Exercise: Exercise Basics: Building Your Action Plan Clinical staff led group instruction and group discussion with PowerPoint presentation and patient guidebook. To enhance the learning environment the use of posters, models and videos may be added. At the conclusion of this workshop, patients will comprehend the difference between physical activity and exercise, as well as the benefits of incorporating both, into their routine. Patients will understand the FITT (Frequency, Intensity, Time, and Type) principle and how to use it to build an exercise action plan. In addition, safety concerns and other considerations for exercise and cardiac rehab will be addressed by the presenter. The purpose of this lesson is to promote a comprehensive and effective weekly exercise routine in order to improve patients' overall level of fitness.   Managing Heart Disease: Your Path to a Healthier Heart Clinical staff led group instruction and group discussion with PowerPoint presentation and patient guidebook. To enhance the learning environment the use of posters, models and videos may be added.At the conclusion of this workshop, patients will understand the anatomy and physiology of the heart. Additionally, they will understand how Pritikin's three pillars impact the risk factors, the progression, and the management of heart disease.  The purpose of this lesson is to provide a high-level overview of the heart, heart disease, and how the Pritikin lifestyle positively  impacts risk factors.  Exercise Biomechanics Clinical staff led group instruction and group discussion with PowerPoint presentation and patient guidebook. To enhance the learning environment the use of posters, models and videos may be added. Patients will learn how the structural parts of their bodies function and how these functions impact their daily activities, movement, and exercise. Patients will learn how to promote a neutral spine, learn how to manage pain, and identify ways to improve their physical movement in order to promote healthy living. The purpose of this lesson is to expose patients to common physical limitations that impact physical activity. Participants will learn practical ways to adapt and manage aches and pains, and to minimize their effect on regular exercise. Patients will learn how to maintain good posture while sitting, walking, and lifting.  Balance Training and Fall Prevention  Clinical staff led group instruction and group discussion with PowerPoint presentation and patient guidebook. To enhance the learning environment the use of posters, models and videos may be added. At the conclusion of this workshop, patients will understand the importance of their sensorimotor skills (vision, proprioception, and the vestibular system) in maintaining their ability to balance as they age. Patients will apply a variety of balancing exercises that are appropriate for their current level of function. Patients will  understand the common causes for poor balance, possible solutions to these problems, and ways to modify their physical environment in order to minimize their fall risk. The purpose of this lesson is to teach patients about the importance of maintaining balance as they age and ways to minimize their risk of falling.  WORKSHOPS   Nutrition:  Fueling a Ship broker led group instruction and group discussion with PowerPoint presentation and patient  guidebook. To enhance the learning environment the use of posters, models and videos may be added. Patients will review the foundational principles of the Pritikin Eating Plan and understand what constitutes a serving size in each of the food groups. Patients will also learn Pritikin-friendly foods that are better choices when away from home and review make-ahead meal and snack options. Calorie density will be reviewed and applied to three nutrition priorities: weight maintenance, weight loss, and weight gain. The purpose of this lesson is to reinforce (in a group setting) the key concepts around what patients are recommended to eat and how to apply these guidelines when away from home by planning and selecting Pritikin-friendly options. Patients will understand how calorie density may be adjusted for different weight management goals.  Mindful Eating  Clinical staff led group instruction and group discussion with PowerPoint presentation and patient guidebook. To enhance the learning environment the use of posters, models and videos may be added. Patients will briefly review the concepts of the Pritikin Eating Plan and the importance of low-calorie dense foods. The concept of mindful eating will be introduced as well as the importance of paying attention to internal hunger signals. Triggers for non-hunger eating and techniques for dealing with triggers will be explored. The purpose of this lesson is to provide patients with the opportunity to review the basic principles of the Pritikin Eating Plan, discuss the value of eating mindfully and how to measure internal cues of hunger and fullness using the Hunger Scale. Patients will also discuss reasons for non-hunger eating and learn strategies to use for controlling emotional eating.  Targeting Your Nutrition Priorities Clinical staff led group instruction and group discussion with PowerPoint presentation and patient guidebook. To enhance the learning  environment the use of posters, models and videos may be added. Patients will learn how to determine their genetic susceptibility to disease by reviewing their family history. Patients will gain insight into the importance of diet as part of an overall healthy lifestyle in mitigating the impact of genetics and other environmental insults. The purpose of this lesson is to provide patients with the opportunity to assess their personal nutrition priorities by looking at their family history, their own health history and current risk factors. Patients will also be able to discuss ways of prioritizing and modifying the Pritikin Eating Plan for their highest risk areas  Menu  Clinical staff led group instruction and group discussion with PowerPoint presentation and patient guidebook. To enhance the learning environment the use of posters, models and videos may be added. Using menus brought in from [employer], or printed from [employer], patients will apply the Pritikin dining out guidelines that were presented in the [employer] video. Patients will also be able to practice these guidelines in a variety of provided scenarios. The purpose of this lesson is to provide patients with the opportunity to practice hands-on learning of the Pritikin Dining Out guidelines with actual menus and practice scenarios.  Label Reading Clinical staff led group instruction and group discussion with PowerPoint presentation and  patient guidebook. To enhance the learning environment the use of posters, models and videos may be added. Patients will review and discuss the Pritikin label reading guidelines presented in Pritikin's Label Reading Educational series video. Using fool labels brought in from local grocery stores and markets, patients will apply the label reading guidelines and determine if the packaged food meet the Pritikin guidelines. The purpose of this lesson is to provide patients with the  opportunity to review, discuss, and practice hands-on learning of the Pritikin Label Reading guidelines with actual packaged food labels. Cooking School  Pritikin's [employer] are designed to teach patients ways to prepare quick, simple, and affordable recipes at home. The importance of nutrition's role in chronic disease risk reduction is reflected in its emphasis in the overall Pritikin program. By learning how to prepare essential core Pritikin Eating Plan recipes, patients will increase control over what they eat; be able to customize the flavor of foods without the use of added salt, sugar, or fat; and improve the quality of the food they consume. By learning a set of core recipes which are easily assembled, quickly prepared, and affordable, patients are more likely to prepare more healthy foods at home. These workshops focus on convenient breakfasts, simple entres, side dishes, and desserts which can be prepared with minimal effort and are consistent with nutrition recommendations for cardiovascular risk reduction. Cooking [employer] are taught by a Armed forces logistics/support/administrative officer (RD) who has been trained by the [employer]. The chef or RD has a clear understanding of the importance of minimizing - if not completely eliminating - added fat, sugar, and sodium in recipes. Throughout the series of Cooking School Workshop sessions, patients will learn about healthy ingredients and efficient methods of cooking to build confidence in their capability to prepare    Cooking School weekly topics:  Adding Flavor- Sodium-Free  Fast and Healthy Breakfasts  Powerhouse Plant-Based Proteins  Satisfying Salads and Dressings  Simple Sides and Sauces  International Cuisine-Spotlight on the [employer] Zones  Delicious Desserts  Savory Soups  [employer] - Meals in a Astronomer Appetizers and Snacks  Comforting Weekend Breakfasts  One-Pot Wonders   Fast Evening Meals  Biomedical scientist Your Pritikin Plate  WORKSHOPS   Healthy Mindset (Psychosocial):  Focused Goals, Sustainable Changes Clinical staff led group instruction and group discussion with PowerPoint presentation and patient guidebook. To enhance the learning environment the use of posters, models and videos may be added. Patients will be able to apply effective goal setting strategies to establish at least one personal goal, and then take consistent, meaningful action toward that goal. They will learn to identify common barriers to achieving personal goals and develop strategies to overcome them. Patients will also gain an understanding of how our mind-set can impact our ability to achieve goals and the importance of cultivating a positive and growth-oriented mind-set. The purpose of this lesson is to provide patients with a deeper understanding of how to set and achieve personal goals, as well as the tools and strategies needed to overcome common obstacles which may arise along the way.  From Head to Heart: The Power of a Healthy Outlook  Clinical staff led group instruction and group discussion with PowerPoint presentation and patient guidebook. To enhance the learning environment the use of posters, models and videos may be added. Patients will be able to recognize and describe the impact of emotions and mood on physical health. They will discover the  importance of self-care and explore self-care practices which may work for them. Patients will also learn how to utilize the 4 C's to cultivate a healthier outlook and better manage stress and challenges. The purpose of this lesson is to demonstrate to patients how a healthy outlook is an essential part of maintaining good health, especially as they continue their cardiac rehab journey.  Healthy Sleep for a Healthy Heart Clinical staff led group instruction and group discussion with PowerPoint presentation and patient guidebook. To enhance the  learning environment the use of posters, models and videos may be added. At the conclusion of this workshop, patients will be able to demonstrate knowledge of the importance of sleep to overall health, well-being, and quality of life. They will understand the symptoms of, and treatments for, common sleep disorders. Patients will also be able to identify daytime and nighttime behaviors which impact sleep, and they will be able to apply these tools to help manage sleep-related challenges. The purpose of this lesson is to provide patients with a general overview of sleep and outline the importance of quality sleep. Patients will learn about a few of the most common sleep disorders. Patients will also be introduced to the concept of "sleep hygiene," and discover ways to self-manage certain sleeping problems through simple daily behavior changes. Finally, the workshop will motivate patients by clarifying the links between quality sleep and their goals of heart-healthy living.   Recognizing and Reducing Stress Clinical staff led group instruction and group discussion with PowerPoint presentation and patient guidebook. To enhance the learning environment the use of posters, models and videos may be added. At the conclusion of this workshop, patients will be able to understand the types of stress reactions, differentiate between acute and chronic stress, and recognize the impact that chronic stress has on their health. They will also be able to apply different coping mechanisms, such as reframing negative self-talk. Patients will have the opportunity to practice a variety of stress management techniques, such as deep abdominal breathing, progressive muscle relaxation, and/or guided imagery.  The purpose of this lesson is to educate patients on the role of stress in their lives and to provide healthy techniques for coping with it.  Learning Barriers/Preferences:  Learning Barriers/Preferences - 11/17/22 1014        Learning Barriers/Preferences   Learning Barriers None    Learning Preferences Audio;Skilled Demonstration;Computer/Internet;Verbal Instruction;Group Instruction;Video;Individual Instruction;Written Material;Pictoral             Education Topics:  Knowledge Questionnaire Score:  Knowledge Questionnaire Score - 11/17/22 0902       Knowledge Questionnaire Score   Pre Score 23/24             Core Components/Risk Factors/Patient Goals at Admission:  Personal Goals and Risk Factors at Admission - 11/17/22 1015       Core Components/Risk Factors/Patient Goals on Admission    Weight Management Yes    Intervention Weight Management: Develop a combined nutrition and exercise program designed to reach desired caloric intake, while maintaining appropriate intake of nutrient and fiber, sodium and fats, and appropriate energy expenditure required for the weight goal.;Weight Management: Provide education and appropriate resources to help participant work on and attain dietary goals.    Expected Outcomes Weight Maintenance: Understanding of the daily nutrition guidelines, which includes 25-35% calories from fat, 7% or less cal from saturated fats, less than 200mg  cholesterol, less than 1.5gm of sodium, & 5 or more servings of fruits and vegetables daily;Understanding recommendations for meals  to include 15-35% energy as protein, 25-35% energy from fat, 35-60% energy from carbohydrates, less than 200mg  of dietary cholesterol, 20-35 gm of total fiber daily;Understanding of distribution of calorie intake throughout the day with the consumption of 4-5 meals/snacks    Heart Failure Yes    Intervention Provide a combined exercise and nutrition program that is supplemented with education, support and counseling about heart failure. Directed toward relieving symptoms such as shortness of breath, decreased exercise tolerance, and extremity edema.    Expected Outcomes Improve functional capacity of  life;Short term: Attendance in program 2-3 days a week with increased exercise capacity. Reported lower sodium intake. Reported increased fruit and vegetable intake. Reports medication compliance.;Short term: Daily weights obtained and reported for increase. Utilizing diuretic protocols set by physician.;Long term: Adoption of self-care skills and reduction of barriers for early signs and symptoms recognition and intervention leading to self-care maintenance.    Hypertension Yes    Intervention Provide education on lifestyle modifcations including regular physical activity/exercise, weight management, moderate sodium restriction and increased consumption of fresh fruit, vegetables, and low fat dairy, alcohol moderation, and smoking cessation.;Monitor prescription use compliance.    Expected Outcomes Short Term: Continued assessment and intervention until BP is < 140/14mm HG in hypertensive participants. < 130/54mm HG in hypertensive participants with diabetes, heart failure or chronic kidney disease.;Long Term: Maintenance of blood pressure at goal levels.    Lipids Yes    Intervention Provide education and support for participant on nutrition & aerobic/resistive exercise along with prescribed medications to achieve LDL 70mg , HDL >40mg .    Expected Outcomes Short Term: Participant states understanding of desired cholesterol values and is compliant with medications prescribed. Participant is following exercise prescription and nutrition guidelines.;Long Term: Cholesterol controlled with medications as prescribed, with individualized exercise RX and with personalized nutrition plan. Value goals: LDL < 70mg , HDL > 40 mg.    Stress Yes    Intervention Offer individual and/or small group education and counseling on adjustment to heart disease, stress management and health-related lifestyle change. Teach and support self-help strategies.;Refer participants experiencing significant psychosocial distress to  appropriate mental health specialists for further evaluation and treatment. When possible, include family members and significant others in education/counseling sessions.    Expected Outcomes Short Term: Participant demonstrates changes in health-related behavior, relaxation and other stress management skills, ability to obtain effective social support, and compliance with psychotropic medications if prescribed.;Long Term: Emotional wellbeing is indicated by absence of clinically significant psychosocial distress or social isolation.             Core Components/Risk Factors/Patient Goals Review:    Core Components/Risk Factors/Patient Goals at Discharge (Final Review):    ITP Comments:  ITP Comments     Row Name 11/17/22 0819           ITP Comments [name], MD: Medical Director.  Introduction to the [facility] / Intensive Cardiac Rehab.  Initial orientation packet reviewed with the patient.                Comments: Participant attended orientation for the cardiac rehabilitation program on  11/17/2022 to perform initial intake and exercise walk test. Patient introduced to the Pritikin Program education and orientation packet was reviewed. Completed 6-minute walk test, measurements, initial ITP, and exercise prescription. Vital signs stable. Telemetry-normal sinus rhythm, with ST wave depression, which has been previously documents. See previous documentation.asymptomatic.   Service time was from 7:50 to 9:45.

## 2022-11-17 NOTE — Progress Notes (Signed)
Cardiac Rehab Medication Review by a Nurse   Does the patient  feel that his/her medications are working for him/her?  yes   Has the patient been experiencing any side effects to the medications prescribed?  yes   Does the patient measure his/her own blood pressure or blood glucose at home?  no   Does the patient have any problems obtaining medications due to transportation or finances?   No   Understanding of regimen: excellent Understanding of indications: excellent Potential of compliance: excellent     Recommended to patient to check BP  2 hour after taking BP meds and to keep a BP diary to give to cardiologist. Pt verbalized understanding. Nurse comments:

## 2022-11-19 ENCOUNTER — Telehealth: Payer: Self-pay | Admitting: *Deleted

## 2022-11-19 NOTE — Telephone Encounter (Signed)
   Pre-operative Risk Assessment    Patient Name: Craig Macias  DOB: Jul 19, 1970 MRN: [id number]     Request for Surgical Clearance    Procedure:   Teeth cleaning  Date of Surgery:  Clearance 11/19/22                                 Surgeon:  Dr. Girtha Rm Surgeon's Group or Practice Name:  [facility] Phone number:  [phone] Fax number:  [phone]   Type of Clearance Requested:   - Medical  - Pharmacy:  Hold Aspirin and Ticagrelor (Brilinta) TBD by cardiologist   Type of Anesthesia:  None    Additional requests/questions:  Does this patient need antibiotics?  [name]   11/19/2022, 12:24 PM

## 2022-11-20 NOTE — Telephone Encounter (Signed)
   Patient Name: Craig Macias  DOB: 03/04/1971 MRN: [id number]  Primary Cardiologist: Little Ishikawa, MD  Chart reviewed as part of pre-operative protocol coverage. Pre-op clearance already addressed by colleagues in earlier phone notes. To summarize recommendations:  -Patient should wait minimum 6 weeks from his MI for any dental work.  Should not hold ASA/Brilinta.  No SBE prophylaxis needed  -Dr. Bjorn Pippin  Will route this bundled recommendation to requesting provider via Epic fax function and remove from pre-op pool. Please call with questions.  Sharlene Dory, PA-C 11/20/2022, 9:21 AM

## 2022-11-23 ENCOUNTER — Encounter (HOSPITAL_COMMUNITY): Payer: BC Managed Care – PPO

## 2022-11-25 ENCOUNTER — Encounter (HOSPITAL_COMMUNITY): Payer: BC Managed Care – PPO

## 2022-11-27 ENCOUNTER — Encounter (HOSPITAL_COMMUNITY)
Admission: RE | Admit: 2022-11-27 | Discharge: 2022-11-27 | Disposition: A | Discharge status: Home / Self Care | Payer: BC Managed Care – PPO | Source: Ambulatory Visit | Attending: Cardiology | Admitting: Cardiology

## 2022-11-27 DIAGNOSIS — I2111 ST elevation (STEMI) myocardial infarction involving right coronary artery: Secondary | ICD-10-CM

## 2022-11-27 DIAGNOSIS — Z955 Presence of coronary angioplasty implant and graft: Secondary | ICD-10-CM | POA: Diagnosis present

## 2022-11-27 NOTE — Progress Notes (Signed)
Daily Session Note  Patient Details  Name: Craig Macias MRN: [id number] Date of Birth: [birth year] Referring Provider:   Flowsheet Row INTENSIVE CARDIAC REHAB ORIENT from 11/17/2022 in [facility]  Referring Provider Epifanio Lesches, MD       Encounter Date: 11/27/2022  Check In:  Session Check In - 11/27/22 0939       Check-In   Supervising physician immediately available to respond to emergencies [facility] MD immediately available    Physician(s) Eligha Bridegroom, NP    Location [facility]    Staff Present Lorin Picket, MS, ACSM-CEP, CCRP, Exercise Physiologist;Bee Marchiano, RN, Zachery Conch, MS, ACSM-CEP, Exercise Physiologist;Bailey Wallace Cullens, MS, Exercise Physiologist    Virtual Visit No    Medication changes reported     No    Fall or balance concerns reported    No    Tobacco Cessation No Change    Warm-up and Cool-down Performed as group-led instruction    Resistance Training Performed Yes    VAD Patient? No    PAD/SET Patient? No      Pain Assessment   Currently in Pain? No/denies    Pain Score 0-No pain    Multiple Pain Sites No             Capillary Blood Glucose: No results found for this or any previous visit (from the past 24 hour(s)).   Exercise Prescription Changes - 11/27/22 1500       Response to Exercise   Blood Pressure (Admit) 108/70    Blood Pressure (Exercise) 158/82    Blood Pressure (Exit) 100/62    Heart Rate (Admit) 70 bpm    Heart Rate (Exercise) 101 bpm    Heart Rate (Exit) 68 bpm    Rating of Perceived Exertion (Exercise) 11    Symptoms None    Comments Pt's first day in the CRP2 program    Duration Continue with 30 min of aerobic exercise without signs/symptoms of physical distress.    Intensity THRR unchanged      Progression   Progression Continue to progress workloads to maintain intensity without signs/symptoms of physical distress.     Average METs 3.5      Resistance Training   Training Prescription Yes    Weight 5 lbs    Reps 10-15    Time 10 Minutes      Interval Training   Interval Training No      Treadmill   MPH 3.2    Grade 0    Minutes 15    METs 3.45      Recumbant Elliptical   RPM 57    Watts 89    Minutes 15    METs 3.6             Social History   Tobacco Use  Smoking Status Some Days  Smokeless Tobacco Former  Tobacco Comments   social    Goals Met:  Exercise tolerated well No report of concerns or symptoms today Strength training completed today  Goals Unmet:  Not Applicable  Comments: Pt started cardiac rehab today.  Pt tolerated light exercise without difficulty. VSS, telemetry-Sinus Rhythm T wave inversion, asymptomatic.  Medication list reconciled. Pt denies barriers to medicaiton compliance.  PSYCHOSOCIAL ASSESSMENT:  PHQ-6. Pt exhibits positive coping skills, hopeful outlook with supportive family. No psychosocial needs identified at this time, no psychosocial interventions necessary.    Pt enjoys golf, traveling.  Pt oriented to exercise equipment and routine.    Understanding verbalized.Harrell Gave RN BSN    Dr. Fransico Him is Medical Director for Cardiac Rehab at [facility].

## 2022-11-30 ENCOUNTER — Encounter (HOSPITAL_COMMUNITY)
Admission: RE | Admit: 2022-11-30 | Discharge: 2022-11-30 | Disposition: A | Discharge status: Home / Self Care | Payer: BC Managed Care – PPO | Source: Ambulatory Visit | Attending: Cardiology | Admitting: Cardiology

## 2022-11-30 DIAGNOSIS — Z955 Presence of coronary angioplasty implant and graft: Secondary | ICD-10-CM

## 2022-11-30 DIAGNOSIS — I2111 ST elevation (STEMI) myocardial infarction involving right coronary artery: Secondary | ICD-10-CM

## 2022-11-30 NOTE — Progress Notes (Signed)
QUALITY OF LIFE SCORE REVIEW  Pt completed Quality of Life survey as a participant in Cardiac Rehab.  Scores 19.0 or below are considered low.  **Pt score very low in several areas** Overall 16.99, Health and Function 16.73, socioeconomic 20.93, physiological and spiritual 11.14, family 20.40. Patient quality of life slightly altered by physical constraints which limits ability to perform as prior to recent cardiac illness.  Upon reviewing QOL and PHQ-9, patient stated he is getting over the shock of his cardiac event. He stated things are improving for him, and he is waiting for his July cardiac test results, hoping things have improved.  He still blames himself for getting sick, stating  it was hard for him to see his children crying when he was in the hospital. 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 He worries and doesn't sleep at night. Recommended he speak with his cardiologist about getting an accommodation for decreasing his workload. Offered emotional support and reassurance.  Will continue to monitor and intervene as necessary.

## 2022-12-02 ENCOUNTER — Encounter (HOSPITAL_COMMUNITY)
Admission: RE | Admit: 2022-12-02 | Discharge: 2022-12-02 | Disposition: A | Discharge status: Home / Self Care | Payer: BC Managed Care – PPO | Source: Ambulatory Visit | Attending: Cardiology | Admitting: Cardiology

## 2022-12-02 DIAGNOSIS — I2111 ST elevation (STEMI) myocardial infarction involving right coronary artery: Secondary | ICD-10-CM | POA: Diagnosis not present

## 2022-12-02 DIAGNOSIS — Z955 Presence of coronary angioplasty implant and graft: Secondary | ICD-10-CM

## 2022-12-04 ENCOUNTER — Encounter (HOSPITAL_COMMUNITY): Payer: BC Managed Care – PPO

## 2022-12-07 ENCOUNTER — Encounter (HOSPITAL_COMMUNITY): Payer: BC Managed Care – PPO

## 2022-12-09 ENCOUNTER — Encounter (HOSPITAL_COMMUNITY): Payer: BC Managed Care – PPO

## 2022-12-11 ENCOUNTER — Encounter (HOSPITAL_COMMUNITY): Payer: BC Managed Care – PPO

## 2022-12-14 ENCOUNTER — Encounter (HOSPITAL_COMMUNITY)
Admission: RE | Admit: 2022-12-14 | Discharge: 2022-12-14 | Disposition: A | Discharge status: Home / Self Care | Payer: BC Managed Care – PPO | Source: Ambulatory Visit | Attending: Cardiology | Admitting: Cardiology

## 2022-12-14 DIAGNOSIS — Z955 Presence of coronary angioplasty implant and graft: Secondary | ICD-10-CM | POA: Insufficient documentation

## 2022-12-14 DIAGNOSIS — I2111 ST elevation (STEMI) myocardial infarction involving right coronary artery: Secondary | ICD-10-CM

## 2022-12-14 DIAGNOSIS — Z5189 Encounter for other specified aftercare: Secondary | ICD-10-CM | POA: Insufficient documentation

## 2022-12-14 DIAGNOSIS — I252 Old myocardial infarction: Secondary | ICD-10-CM | POA: Diagnosis not present

## 2022-12-15 NOTE — Progress Notes (Signed)
Cardiac Individual Treatment Plan  Patient Details  Name: Craig Macias MRN: [id number] Date of Birth: [birth year] Referring Provider:   Flowsheet Row INTENSIVE CARDIAC REHAB ORIENT from 11/17/2022 in [facility]  Referring Provider Epifanio Lesches, MD       Initial Encounter Date:  Flowsheet Row INTENSIVE CARDIAC REHAB ORIENT from 11/17/2022 in [facility]  Date 11/17/22       Visit Diagnosis: 10/27/22 STEMI  10/28/22 Sp DES RPDA  Patient's Home Medications on Admission:  Current Outpatient Medications:    ASCORBIC ACID PO, Take 1 tablet by mouth daily. Vitamin C, unknown strength., Disp: , Rfl:    aspirin EC 81 MG tablet, Take 1 tablet (81 mg total) by mouth daily. Swallow whole., Disp: 90 tablet, Rfl: 1   Evolocumab (REPATHA SURECLICK) 140 MG/ML SOAJ, Inject 140 mg into the skin every 14 (fourteen) days., Disp: 6 mL, Rfl: 1   fexofenadine (ALLEGRA) 180 MG tablet, Take 180 mg by mouth daily., Disp: , Rfl:    losartan (COZAAR) 25 MG tablet, Take 1 tablet (25 mg total) by mouth daily., Disp: 90 tablet, Rfl: 1   MAGNESIUM OXIDE PO, Take 1 tablet by mouth at bedtime., Disp: , Rfl:    metoprolol succinate (TOPROL-XL) 25 MG 24 hr tablet, Take 1 tablet (25 mg total) by mouth daily., Disp: 90 tablet, Rfl: 1   Multiple Vitamin (MULTIVITAMIN ADULT PO), 1 tablet Orally once a day, Disp: , Rfl:    nitroGLYCERIN (NITROSTAT) 0.4 MG SL tablet, Place 1 tablet (0.4 mg total) under the tongue every 5 (five) minutes x 3 doses as needed for chest pain., Disp: 25 tablet, Rfl: 1   pseudoephedrine (SUDAFED) 30 MG tablet, Take 30 mg by mouth 2 (two) times daily as needed for congestion. (Patient not taking: Reported on 11/17/2022), Disp: , Rfl:    ticagrelor (BRILINTA) 90 MG TABS tablet, Take 1 tablet (90 mg total) by mouth 2 (two) times daily., Disp: 180 tablet, Rfl: 1  Past Medical  History: Past Medical History:  Diagnosis Date   Coronary artery disease    Hyperlipidemia    Hypertension     Tobacco Use: Social History   Tobacco Use  Smoking Status Some Days  Smokeless Tobacco Former  Tobacco Comments   social    Labs: Review Flowsheet       Latest Ref Rng & Units 10/28/2022  Labs for ITP Cardiac and Pulmonary Rehab  Cholestrol 0 - 200 mg/dL 865   LDL (calc) 0 - 99 mg/dL 80   HDL-C >78 mg/dL 58   Trlycerides <469 mg/dL 49   Hemoglobin G2X 4.8 - 5.6 % 4.8     Capillary Blood Glucose: No results found for: "GLUCAP"   Exercise Target Goals: Exercise Program Goal: Individual exercise prescription set using results from initial 6 min walk test and THRR while considering  patient's activity barriers and safety.   Exercise Prescription Goal: Initial exercise prescription builds to 30-45 minutes a day of aerobic activity, 2-3 days per week.  Home exercise guidelines will be given to patient during program as part of exercise prescription that the participant will acknowledge.  Activity Barriers & Risk Stratification:  Activity Barriers & Cardiac Risk Stratification - 11/17/22 1009       Activity Barriers & Cardiac Risk Stratification   Activity Barriers Deconditioning;Shortness of Breath    Cardiac Risk Stratification High  6 Minute Walk:  6 Minute Walk     Row Name 11/17/22 0930         6 Minute Walk   Phase Initial     Distance 1466 feet     Walk Time 6 minutes     # of Rest Breaks 0     MPH 2.8     METS 4.11     RPE 9     Perceived Dyspnea  0     VO2 Peak 14.4     Symptoms No     Resting HR 81 bpm     Resting BP 90/70     Resting Oxygen Saturation  100 %     Exercise Oxygen Saturation  during 6 min walk 98 %     Max Ex. HR 85 bpm     Max Ex. BP 110/70     2 Minute Post BP 100/60              Oxygen Initial Assessment:   Oxygen Re-Evaluation:   Oxygen Discharge (Final Oxygen  Re-Evaluation):   Initial Exercise Prescription:  Initial Exercise Prescription - 11/17/22 1000       Date of Initial Exercise RX and Referring Provider   Date 11/17/22    Referring Provider Epifanio Lesches, MD    Expected Discharge Date 01/29/23      Treadmill   MPH 3.2    Grade 0    Minutes 15    METs 3.45      Recumbant Elliptical   Level 2    RPM 60    Watts 25    Minutes 15    METs 4.1      Prescription Details   Frequency (times per week) 3    Duration Progress to 30 minutes of continuous aerobic without signs/symptoms of physical distress      Intensity   THRR 40-80% of Max Heartrate 68-137    Ratings of Perceived Exertion 11-13    Perceived Dyspnea 0-4      Progression   Progression Continue progressive overload as per policy without signs/symptoms or physical distress.      Resistance Training   Training Prescription Yes    Weight 5 lbs    Reps 10-15             Perform Capillary Blood Glucose checks as needed.  Exercise Prescription Changes:   Exercise Prescription Changes     Row Name 11/27/22 1500 12/16/22 1200           Response to Exercise   Blood Pressure (Admit) 108/70 110/70      Blood Pressure (Exercise) 158/82 136/80      Blood Pressure (Exit) 100/62 104/68      Heart Rate (Admit) 70 bpm 70 bpm      Heart Rate (Exercise) 101 bpm 109 bpm      Heart Rate (Exit) 68 bpm 85 bpm      Rating of Perceived Exertion (Exercise) 11 12      Symptoms None None      Comments Pt's first day in the CRP2 program Reviewed METs and home exercise Rx      Duration Continue with 30 min of aerobic exercise without signs/symptoms of physical distress. Continue with 30 min of aerobic exercise without signs/symptoms of physical distress.      Intensity THRR unchanged THRR unchanged        Progression   Progression Continue to progress workloads to maintain intensity without  signs/symptoms of physical distress. Continue to progress workloads to  maintain intensity without signs/symptoms of physical distress.      Average METs 3.5 4.48        Resistance Training   Training Prescription Yes No      Weight 5 lbs No weights on Wednesdays      Reps 10-15 --      Time 10 Minutes --        Interval Training   Interval Training No No        Treadmill   MPH 3.2 3.5      Grade 0 1      Minutes 15 15      METs 3.45 4.16        Recumbant Elliptical   Level -- 4      RPM 57 72      Watts 89 124      Minutes 15 15      METs 3.6 4.8        Home Exercise Plan   Plans to continue exercise at -- Home (comment)      Frequency -- Add 4 additional days to program exercise sessions.      Initial Home Exercises Provided -- 12/16/22               Exercise Comments:   Exercise Comments     Row Name 11/27/22 1546 12/16/22 1213         Exercise Comments Pt's first day in the CRP2 program. Pt exercise without complaints. Reviewed METS and home exercise Rx. Pt is making good progress. Pt currenly walks at home in the evenings and will continue to do so. Pt verbalized understanding of the home exercise Rx and was provided a copy.               Exercise Goals and Review:   Exercise Goals     Row Name 11/17/22 1012             Exercise Goals   Increase Physical Activity Yes       Intervention Provide advice, education, support and counseling about physical activity/exercise needs.;Develop an individualized exercise prescription for aerobic and resistive training based on initial evaluation findings, risk stratification, comorbidities and participant's personal goals.       Expected Outcomes Short Term: Attend rehab on a regular basis to increase amount of physical activity.;Long Term: Add in home exercise to make exercise part of routine and to increase amount of physical activity.;Long Term: Exercising regularly at least 3-5 days a week.       Increase Strength and Stamina Yes       Intervention Provide advice, education,  support and counseling about physical activity/exercise needs.;Develop an individualized exercise prescription for aerobic and resistive training based on initial evaluation findings, risk stratification, comorbidities and participant's personal goals.       Expected Outcomes Short Term: Increase workloads from initial exercise prescription for resistance, speed, and METs.;Short Term: Perform resistance training exercises routinely during rehab and add in resistance training at home;Long Term: Improve cardiorespiratory fitness, muscular endurance and strength as measured by increased METs and functional capacity ( )       Able to understand and use rate of perceived exertion (RPE) scale Yes       Intervention Provide education and explanation on how to use RPE scale       Expected Outcomes Short Term: Able to use RPE daily in rehab to express subjective intensity level;Long  Term:  Able to use RPE to guide intensity level when exercising independently       Knowledge and understanding of Target Heart Rate Range (THRR) Yes       Intervention Provide education and explanation of THRR including how the numbers were predicted and where they are located for reference       Expected Outcomes Short Term: Able to state/look up THRR;Short Term: Able to use daily as guideline for intensity in rehab;Long Term: Able to use THRR to govern intensity when exercising independently       Understanding of Exercise Prescription Yes       Intervention Provide education, explanation, and written materials on patient's individual exercise prescription       Expected Outcomes Short Term: Able to explain program exercise prescription;Long Term: Able to explain home exercise prescription to exercise independently                Exercise Goals Re-Evaluation :  Exercise Goals Re-Evaluation     Row Name 11/27/22 1545             Exercise Goal Re-Evaluation   Exercise Goals Review Increase Physical  Activity;Increase Strength and Stamina;Able to understand and use rate of perceived exertion (RPE) scale;Knowledge and understanding of Target Heart Rate Range (THRR);Understanding of Exercise Prescription       Comments Pt's first day in the CRP2 program. Pt understands the exercise Rx, RPE scale and THRR.       Expected Outcomes Will continue to monitor patient and progress exercise workloads as tolerated.                Discharge Exercise Prescription (Final Exercise Prescription Changes):  Exercise Prescription Changes - 12/16/22 1200       Response to Exercise   Blood Pressure (Admit) 110/70    Blood Pressure (Exercise) 136/80    Blood Pressure (Exit) 104/68    Heart Rate (Admit) 70 bpm    Heart Rate (Exercise) 109 bpm    Heart Rate (Exit) 85 bpm    Rating of Perceived Exertion (Exercise) 12    Symptoms None    Comments Reviewed METs and home exercise Rx    Duration Continue with 30 min of aerobic exercise without signs/symptoms of physical distress.    Intensity THRR unchanged      Progression   Progression Continue to progress workloads to maintain intensity without signs/symptoms of physical distress.    Average METs 4.48      Resistance Training   Training Prescription No    Weight No weights on Wednesdays      Interval Training   Interval Training No      Treadmill   MPH 3.5    Grade 1    Minutes 15    METs 4.16      Recumbant Elliptical   Level 4    RPM 72    Watts 124    Minutes 15    METs 4.8      Home Exercise Plan   Plans to continue exercise at Home (comment)    Frequency Add 4 additional days to program exercise sessions.    Initial Home Exercises Provided 12/16/22             Nutrition:  Target Goals: Understanding of nutrition guidelines, daily intake of sodium 1500mg , cholesterol 200mg , calories 30% from fat and 7% or less from saturated fats, daily to have 5 or more servings of fruits and vegetables.  Biometrics:  Pre Biometrics  - 11/17/22 0818       Pre Biometrics   Waist Circumference 38 inches    Hip Circumference 42.5 inches    Waist to Hip Ratio 0.89 %    Triceps Skinfold 12 mm    % Body Fat 24.7 %    Grip Strength 38 kg    Flexibility 0 in   Pt could not reach   Single Leg Stand 30 seconds              Nutrition Therapy Plan and Nutrition Goals:  Nutrition Therapy & Goals - 11/27/22 0943       Nutrition Therapy   Diet Heart Healthy Diet      Personal Nutrition Goals   Nutrition Goal Patient to identify strategies for reducing cardiovascular risk by attending the Pritikin education and nutrition series weekly.    Personal Goal #2 Patient to improve diet quality by using the plate method as a guide for meal planning to include lean protein/plant protein, fruits, vegetables, whole grains, nonfat dairy as part of a well-balanced diet.    Comments Statin was stopped due to elevated LFTs and repatha was started. Per documentation from oncology note on 12/14/17, LFTs have been elevated since at least 2018. Per imaging on 11/2018, consistent with hepatic steatosis fatty liver. Patient will benefit from participation in intensive cardiac rehab for nutrition, exercise, and lifestyle modification.      Intervention Plan   Intervention Prescribe, educate and counsel regarding individualized specific dietary modifications aiming towards targeted core components such as weight, hypertension, lipid management, diabetes, heart failure and other comorbidities.;Nutrition handout(s) given to patient.    Expected Outcomes Short Term Goal: Understand basic principles of dietary content, such as calories, fat, sodium, cholesterol and nutrients.;Long Term Goal: Adherence to prescribed nutrition plan.             Nutrition Assessments:  MEDIFICTS Score Key: ?70 Need to make dietary changes  40-70 Heart Healthy Diet ? 40 Therapeutic Level Cholesterol Diet    Picture Your Plate Scores: <78 Unhealthy dietary  pattern with much room for improvement. 41-50 Dietary pattern unlikely to meet recommendations for good health and room for improvement. 51-60 More healthful dietary pattern, with some room for improvement.  >60 Healthy dietary pattern, although there may be some specific behaviors that could be improved.    Nutrition Goals Re-Evaluation:  Nutrition Goals Re-Evaluation     Row Name 11/27/22 0943             Goals   Current Weight 212 lb 15.4 oz (96.6 kg)       Comment A1c WNL, lipids WNL (LDL 80), per labs 6/9 at [facility] liver enzymes remain elevated AST 53, ALT 112       Expected Outcome Statin was stopped in May 2024 due to elevated LFTs and repatha was started. Per documentation from oncology note on 12/14/17, LFTs have been elevated since at least 2018. Per imaging on 11/2018, consistent with hepatic steatosis/ fatty liver. Patient will benefit from participation in intensive cardiac rehab for nutrition, exercise, and lifestyle modification.                Nutrition Goals Re-Evaluation:  Nutrition Goals Re-Evaluation     Row Name 11/27/22 0943             Goals   Current Weight 212 lb 15.4 oz (96.6 kg)       Comment A1c WNL, lipids WNL (LDL 80), per labs  6/9 at [facility] liver enzymes remain elevated AST 53, ALT 112       Expected Outcome Statin was stopped in May 2024 due to elevated LFTs and repatha was started. Per documentation from oncology note on 12/14/17, LFTs have been elevated since at least 2018. Per imaging on 11/2018, consistent with hepatic steatosis/ fatty liver. Patient will benefit from participation in intensive cardiac rehab for nutrition, exercise, and lifestyle modification.                Nutrition Goals Discharge (Final Nutrition Goals Re-Evaluation):  Nutrition Goals Re-Evaluation - 11/27/22 0943       Goals   Current Weight 212 lb 15.4 oz (96.6 kg)    Comment A1c WNL, lipids WNL (LDL 80), per labs 6/9 at [facility] liver  enzymes remain elevated AST 53, ALT 112    Expected Outcome Statin was stopped in May 2024 due to elevated LFTs and repatha was started. Per documentation from oncology note on 12/14/17, LFTs have been elevated since at least 2018. Per imaging on 11/2018, consistent with hepatic steatosis/ fatty liver. Patient will benefit from participation in intensive cardiac rehab for nutrition, exercise, and lifestyle modification.             Psychosocial: Target Goals: Acknowledge presence or absence of significant depression and/or stress, maximize coping skills, provide positive support system. Participant is able to verbalize types and ability to use techniques and skills needed for reducing stress and depression.  Initial Review & Psychosocial Screening:  Initial Psych Review & Screening - 11/17/22 1137       Initial Review   Current issues with --             Quality of Life Scores:  Quality of Life - 11/17/22 1147       Quality of Life Scores   Health/Function Pre 16.73 %    Socioeconomic Pre 20.93 %    Psych/Spiritual Pre 11.14 %    Family Pre 20.4 %    GLOBAL Pre 16.99 %            Scores of 19 and below usually indicate a poorer quality of life in these areas.  A difference of  2-3 points is a clinically meaningful difference.  A difference of 2-3 points in the total score of the Quality of Life Index has been associated with significant improvement in overall quality of life, self-image, physical symptoms, and general health in studies assessing change in quality of life.  PHQ-9: Review Flowsheet       11/17/2022  Depression screen PHQ 2/9  Decreased Interest 1  Down, Depressed, Hopeless 1  PHQ - 2 Score 2  Altered sleeping 1  Tired, decreased energy 2  Change in appetite 0  Feeling bad or failure about yourself  1  Trouble concentrating 0  Moving slowly or fidgety/restless 0  Suicidal thoughts 0  PHQ-9 Score 6  Difficult doing work/chores Not difficult at all    Interpretation of Total Score  Total Score Depression Severity:  1-4 = Minimal depression, 5-9 = Mild depression, 10-14 = Moderate depression, 15-19 = Moderately severe depression, 20-27 = Severe depression   Psychosocial Evaluation and Intervention:   Psychosocial Re-Evaluation:  Psychosocial Re-Evaluation     Row Name 11/27/22 1622 12/15/22 1736           Psychosocial Re-Evaluation   Current issues with Current Depression Current Depression      Comments Hideki said he did not plan to  spend his summer recovering from a heart attack. Will review quality of life in the upcoming week Quality of life questionnare reviewed on 11/30/22. Ramon just returned from being out of town with his girls at a volleyball tournament      Expected Outcomes Zayon will have decreased or controlled dperession upon completion of intensive cardiac rehab Binh will have decreased or controlled dperession upon completion of intensive cardiac rehab      Interventions Encouraged to attend Cardiac Rehabilitation for the exercise;Stress management education;Relaxation education Encouraged to attend Cardiac Rehabilitation for the exercise;Stress management education;Relaxation education      Continue Psychosocial Services  Follow up required by staff Follow up required by staff               Psychosocial Discharge (Final Psychosocial Re-Evaluation):  Psychosocial Re-Evaluation - 12/15/22 1736       Psychosocial Re-Evaluation   Current issues with Current Depression    Comments Quality of life questionnare reviewed on 11/30/22. Wayde just returned from being out of town with his girls at a volleyball tournament    Expected Outcomes Alexys will have decreased or controlled dperession upon completion of intensive cardiac rehab    Interventions Encouraged to attend Cardiac Rehabilitation for the exercise;Stress management education;Relaxation education    Continue Psychosocial Services  Follow up required by  staff             Vocational Rehabilitation: Provide vocational rehab assistance to qualifying candidates.   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            Education: Education Goals: Education classes will be provided on a weekly basis, covering required topics. Participant will state understanding/return demonstration of topics presented.    Education     Row Name 11/27/22 0900     Education   Cardiac Education Topics Pritikin   Select Core Videos     Core Videos   Educator Exercise Physiologist   Select Psychosocial   Psychosocial Healthy Minds, Bodies, Hearts   Instruction Review Code 1- Verbalizes Understanding   Class Start Time 0815   Class Stop Time 0850   Class Time Calculation (min) 35 min    Row Name 11/30/22 0900     Education   Cardiac Education Topics Pritikin   Glass blower/designer Nutrition   Nutrition Workshop Label Reading   Instruction Review Code 1- Verbalizes Understanding   Class Start Time 0815   Class Stop Time 0858   Class Time Calculation (min) 43 min    Row Name 12/02/22 1400     Education   Cardiac Education Topics Pritikin   Secondary school teacher School   Educator Dietitian   Weekly Topic Delicious Desserts   Instruction Review Code 1- Verbalizes Understanding   Class Start Time [phone]   Class Stop Time 0845   Class Time Calculation (min) 35 min    Row Name 12/14/22 1000     Education   Cardiac Education Topics Pritikin   Licensed conveyancer Nutrition   Nutrition Calorie Density   Instruction Review Code 1- [employer]  Understanding   Class Start Time 0815   Class Stop Time 0857    Class Time Calculation (min) 42 min    Row Name 12/16/22 0900     Education   Cardiac Education Topics Pritikin   Secondary school teacher School   Educator Dietitian   Weekly Topic Efficiency Cooking - Meals in a Snap   Instruction Review Code 1- Verbalizes Understanding   Class Start Time 0815   Class Stop Time 0850   Class Time Calculation (min) 35 min    Row Name 12/16/22 1000     Education   Select --            Core Videos: Exercise    Move It!  Clinical staff conducted group or individual video education with verbal and written material and guidebook.  Patient learns the recommended Pritikin exercise program. Exercise with the goal of living a long, healthy life. Some of the health benefits of exercise include controlled diabetes, healthier blood pressure levels, improved cholesterol levels, improved heart and lung capacity, improved sleep, and better body composition. Everyone should speak with their doctor before starting or changing an exercise routine.  Biomechanical Limitations Clinical staff conducted group or individual video education with verbal and written material and guidebook.  Patient learns how biomechanical limitations can impact exercise and how we can mitigate and possibly overcome limitations to have an impactful and balanced exercise routine.  Body Composition Clinical staff conducted group or individual video education with verbal and written material and guidebook.  Patient learns that body composition (ratio of muscle mass to fat mass) is a key component to assessing overall fitness, rather than body weight alone. Increased fat mass, especially visceral belly fat, can put [place] at increased risk for metabolic syndrome, type 2 diabetes, heart disease, and even death. It is recommended to combine diet and exercise (cardiovascular and resistance training) to improve your body composition. Seek guidance from your physician and exercise  physiologist before implementing an exercise routine.  Exercise Action Plan Clinical staff conducted group or individual video education with verbal and written material and guidebook.  Patient learns the recommended strategies to achieve and enjoy long-term exercise adherence, including variety, self-motivation, self-efficacy, and positive decision making. Benefits of exercise include fitness, good health, weight management, more energy, better sleep, less stress, and overall well-being.  Medical   Heart Disease Risk Reduction Clinical staff conducted group or individual video education with verbal and written material and guidebook.  Patient learns our heart is our most vital organ as it circulates oxygen, nutrients, white blood cells, and hormones throughout the entire body, and carries waste away. Data supports a plant-based eating plan like the Pritikin Program for its effectiveness in slowing progression of and reversing heart disease. The video provides a number of recommendations to address heart disease.   Metabolic Syndrome and Belly Fat  Clinical staff conducted group or individual video education with verbal and written material and guidebook.  Patient learns what metabolic syndrome is, how it leads to heart disease, and how one can reverse it and keep it from coming back. You have metabolic syndrome if you have 3 of the following 5 criteria: abdominal obesity, high blood pressure, high triglycerides, low HDL cholesterol, and high blood sugar.  Hypertension and Heart Disease Clinical staff conducted group or individual video education with verbal and written material and guidebook.  Patient learns that high blood pressure, or hypertension, is very common in the [place]. Hypertension  is largely due to excessive salt intake, but other important risk factors include being overweight, physical inactivity, drinking too much alcohol, smoking, and not eating enough potassium from fruits  and vegetables. High blood pressure is a leading risk factor for heart attack, stroke, congestive heart failure, dementia, kidney failure, and premature death. Long-term effects of excessive salt intake include stiffening of the arteries and thickening of heart muscle and organ damage. Recommendations include ways to reduce hypertension and the risk of heart disease.  Diseases of Our Time - Focusing on Diabetes Clinical staff conducted group or individual video education with verbal and written material and guidebook.  Patient learns why the best way to stop diseases of our time is prevention, through food and other lifestyle changes. Medicine (such as prescription pills and surgeries) is often only a Band-Aid on the problem, not a long-term solution. Most common diseases of our time include obesity, type 2 diabetes, hypertension, heart disease, and cancer. The Pritikin Program is recommended and has been proven to help reduce, reverse, and/or prevent the damaging effects of metabolic syndrome.  Nutrition   Overview of the Pritikin Eating Plan  Clinical staff conducted group or individual video education with verbal and written material and guidebook.  Patient learns about the Pritikin Eating Plan for disease risk reduction. The Pritikin Eating Plan emphasizes a wide variety of unrefined, minimally-processed carbohydrates, like fruits, vegetables, whole grains, and legumes. Go, Caution, and Stop food choices are explained. Plant-based and lean animal proteins are emphasized. Rationale provided for low sodium intake for blood pressure control, low added sugars for blood sugar stabilization, and low added fats and oils for coronary artery disease risk reduction and weight management.  Calorie Density  Clinical staff conducted group or individual video education with verbal and written material and guidebook.  Patient learns about calorie density and how it impacts the Pritikin Eating Plan. Knowing the  characteristics of the food you choose will help you decide whether those foods will lead to weight gain or weight loss, and whether you want to consume more or less of them. Weight loss is usually a side effect of the Pritikin Eating Plan because of its focus on low calorie-dense foods.  Label Reading  Clinical staff conducted group or individual video education with verbal and written material and guidebook.  Patient learns about the Pritikin recommended label reading guidelines and corresponding recommendations regarding calorie density, added sugars, sodium content, and whole grains.  Dining Out - Part 1  Clinical staff conducted group or individual video education with verbal and written material and guidebook.  Patient learns that restaurant meals can be sabotaging because they can be so high in calories, fat, sodium, and/or sugar. Patient learns recommended strategies on how to positively address this and avoid unhealthy pitfalls.  Facts on Fats  Clinical staff conducted group or individual video education with verbal and written material and guidebook.  Patient learns that lifestyle modifications can be just as effective, if not more so, as many medications for lowering your risk of heart disease. A Pritikin lifestyle can help to reduce your risk of inflammation and atherosclerosis (cholesterol build-up, or plaque, in the artery walls). Lifestyle interventions such as dietary choices and physical activity address the cause of atherosclerosis. A review of the types of fats and their impact on blood cholesterol levels, along with dietary recommendations to reduce fat intake is also included.  Nutrition Action Plan  Clinical staff conducted group or individual video education with verbal and written material and  guidebook.  Patient learns how to incorporate Pritikin recommendations into their lifestyle. Recommendations include planning and keeping personal health goals in mind as an important  part of their success.  Healthy Mind-Set    Healthy Minds, Bodies, Hearts  Clinical staff conducted group or individual video education with verbal and written material and guidebook.  Patient learns how to identify when they are stressed. Video will discuss the impact of that stress, as well as the many benefits of stress management. Patient will also be introduced to stress management techniques. The way we think, act, and feel has an impact on our hearts.  How Our Thoughts Can Heal Our Hearts  Clinical staff conducted group or individual video education with verbal and written material and guidebook.  Patient learns that negative thoughts can cause depression and anxiety. This can result in negative lifestyle behavior and serious health problems. Cognitive behavioral therapy is an effective method to help control our thoughts in order to change and improve our emotional outlook.  Additional Videos:  Exercise    Improving Performance  Clinical staff conducted group or individual video education with verbal and written material and guidebook.  Patient learns to use a non-linear approach by alternating intensity levels and lengths of time spent exercising to help burn more calories and lose more body fat. Cardiovascular exercise helps improve heart health, metabolism, hormonal balance, blood sugar control, and recovery from fatigue. Resistance training improves strength, endurance, balance, coordination, reaction time, metabolism, and muscle mass. Flexibility exercise improves circulation, posture, and balance. Seek guidance from your physician and exercise physiologist before implementing an exercise routine and learn your capabilities and proper form for all exercise.  Introduction to Yoga  Clinical staff conducted group or individual video education with verbal and written material and guidebook.  Patient learns about yoga, a discipline of the coming together of mind, breath, and body. The  benefits of yoga include improved flexibility, improved range of motion, better posture and core strength, increased lung function, weight loss, and positive self-image. Yoga's heart health benefits include lowered blood pressure, healthier heart rate, decreased cholesterol and triglyceride levels, improved immune function, and reduced stress. Seek guidance from your physician and exercise physiologist before implementing an exercise routine and learn your capabilities and proper form for all exercise.  Medical   Aging: Enhancing Your Quality of Life  Clinical staff conducted group or individual video education with verbal and written material and guidebook.  Patient learns key strategies and recommendations to stay in good physical health and enhance quality of life, such as prevention strategies, having an advocate, securing a Health Care Proxy and Power of Attorney, and keeping a list of medications and system for tracking them. It also discusses how to avoid risk for bone loss.  Biology of Weight Control  Clinical staff conducted group or individual video education with verbal and written material and guidebook.  Patient learns that weight gain occurs because we consume more calories than we burn (eating more, moving less). Even if your body weight is normal, you may have higher ratios of fat compared to muscle mass. Too much body fat puts you at increased risk for cardiovascular disease, heart attack, stroke, type 2 diabetes, and obesity-related cancers. In addition to exercise, following the Pritikin Eating Plan can help reduce your risk.  Decoding Lab Results  Clinical staff conducted group or individual video education with verbal and written material and guidebook.  Patient learns that lab test reflects one measurement whose values change over time and  are influenced by many factors, including medication, stress, sleep, exercise, food, hydration, pre-existing medical conditions, and more. It is  recommended to use the knowledge from this video to become more involved with your lab results and evaluate your numbers to speak with your doctor.   Diseases of Our Time - Overview  Clinical staff conducted group or individual video education with verbal and written material and guidebook.  Patient learns that according to the CDC, 50% to 70% of chronic diseases (such as obesity, type 2 diabetes, elevated lipids, hypertension, and heart disease) are avoidable through lifestyle improvements including healthier food choices, listening to satiety cues, and increased physical activity.  Sleep Disorders Clinical staff conducted group or individual video education with verbal and written material and guidebook.  Patient learns how good quality and duration of sleep are important to overall health and well-being. Patient also learns about sleep disorders and how they impact health along with recommendations to address them, including discussing with a physician.  Nutrition  Dining Out - Part 2 Clinical staff conducted group or individual video education with verbal and written material and guidebook.  Patient learns how to plan ahead and communicate in order to maximize their dining experience in a healthy and nutritious manner. Included are recommended food choices based on the type of restaurant the patient is visiting.   Fueling a Banker conducted group or individual video education with verbal and written material and guidebook.  There is a strong connection between our food choices and our health. Diseases like obesity and type 2 diabetes are very prevalent and are in large-part due to lifestyle choices. The Pritikin Eating Plan provides plenty of food and hunger-curbing satisfaction. It is easy to follow, affordable, and helps reduce health risks.  Menu Workshop  Clinical staff conducted group or individual video education with verbal and written material and guidebook.   Patient learns that restaurant meals can sabotage health goals because they are often packed with calories, fat, sodium, and sugar. Recommendations include strategies to plan ahead and to communicate with the manager, chef, or server to help order a healthier meal.  Planning Your Eating Strategy  Clinical staff conducted group or individual video education with verbal and written material and guidebook.  Patient learns about the Pritikin Eating Plan and its benefit of reducing the risk of disease. The Pritikin Eating Plan does not focus on calories. Instead, it emphasizes high-quality, nutrient-rich foods. By knowing the characteristics of the foods, we choose, we can determine their calorie density and make informed decisions.  Targeting Your Nutrition Priorities  Clinical staff conducted group or individual video education with verbal and written material and guidebook.  Patient learns that lifestyle habits have a tremendous impact on disease risk and progression. This video provides eating and physical activity recommendations based on your personal health goals, such as reducing LDL cholesterol, losing weight, preventing or controlling type 2 diabetes, and reducing high blood pressure.  Vitamins and Minerals  Clinical staff conducted group or individual video education with verbal and written material and guidebook.  Patient learns different ways to obtain key vitamins and minerals, including through a recommended healthy diet. It is important to discuss all supplements you take with your doctor.   Healthy Mind-Set    Smoking Cessation  Clinical staff conducted group or individual video education with verbal and written material and guidebook.  Patient learns that cigarette smoking and tobacco addiction pose a serious health risk which affects millions of people. Stopping  smoking will significantly reduce the risk of heart disease, lung disease, and many forms of cancer. Recommended  strategies for quitting are covered, including working with your doctor to develop a successful plan.  Culinary   Becoming a Set designer conducted group or individual video education with verbal and written material and guidebook.  Patient learns that cooking at home can be healthy, cost-effective, quick, and puts them in control. Keys to cooking healthy recipes will include looking at your recipe, assessing your equipment needs, planning ahead, making it simple, choosing cost-effective seasonal ingredients, and limiting the use of added fats, salts, and sugars.  Cooking - Breakfast and Snacks  Clinical staff conducted group or individual video education with verbal and written material and guidebook.  Patient learns how important breakfast is to satiety and nutrition through the entire day. Recommendations include key foods to eat during breakfast to help stabilize blood sugar levels and to prevent overeating at meals later in the day. Planning ahead is also a key component.  Cooking - Educational psychologist conducted group or individual video education with verbal and written material and guidebook.  Patient learns eating strategies to improve overall health, including an approach to cook more at home. Recommendations include thinking of animal protein as a side on your plate rather than center stage and focusing instead on lower calorie dense options like vegetables, fruits, whole grains, and plant-based proteins, such as beans. Making sauces in large quantities to freeze for later and leaving the skin on your vegetables are also recommended to maximize your experience.  Cooking - Healthy Salads and Dressing Clinical staff conducted group or individual video education with verbal and written material and guidebook.  Patient learns that vegetables, fruits, whole grains, and legumes are the foundations of the Pritikin Eating Plan. Recommendations include how to  incorporate each of these in flavorful and healthy salads, and how to create homemade salad dressings. Proper handling of ingredients is also covered. Cooking - Soups and [employer] - Soups and Desserts Clinical staff conducted group or individual video education with verbal and written material and guidebook.  Patient learns that Pritikin soups and desserts make for easy, nutritious, and delicious snacks and meal components that are low in sodium, fat, sugar, and calorie density, while high in vitamins, minerals, and filling fiber. Recommendations include simple and healthy ideas for soups and desserts.   Overview     The Pritikin Solution Program Overview Clinical staff conducted group or individual video education with verbal and written material and guidebook.  Patient learns that the results of the Pritikin Program have been documented in more than 100 articles published in peer-reviewed journals, and the benefits include reducing risk factors for (and, in some cases, even reversing) high cholesterol, high blood pressure, type 2 diabetes, obesity, and more! An overview of the three key pillars of the Pritikin Program will be covered: eating well, doing regular exercise, and having a healthy mind-set.  WORKSHOPS  Exercise: Exercise Basics: Building Your Action Plan Clinical staff led group instruction and group discussion with PowerPoint presentation and patient guidebook. To enhance the learning environment the use of posters, models and videos may be added. At the conclusion of this workshop, patients will comprehend the difference between physical activity and exercise, as well as the benefits of incorporating both, into their routine. Patients will understand the FITT (Frequency, Intensity, Time, and Type) principle and how to use it to build an exercise action plan. In  addition, safety concerns and other considerations for exercise and cardiac rehab will be addressed by the  presenter. The purpose of this lesson is to promote a comprehensive and effective weekly exercise routine in order to improve patients' overall level of fitness.   Managing Heart Disease: Your Path to a Healthier Heart Clinical staff led group instruction and group discussion with PowerPoint presentation and patient guidebook. To enhance the learning environment the use of posters, models and videos may be added.At the conclusion of this workshop, patients will understand the anatomy and physiology of the heart. Additionally, they will understand how Pritikin's three pillars impact the risk factors, the progression, and the management of heart disease.  The purpose of this lesson is to provide a high-level overview of the heart, heart disease, and how the Pritikin lifestyle positively impacts risk factors.  Exercise Biomechanics Clinical staff led group instruction and group discussion with PowerPoint presentation and patient guidebook. To enhance the learning environment the use of posters, models and videos may be added. Patients will learn how the structural parts of their bodies function and how these functions impact their daily activities, movement, and exercise. Patients will learn how to promote a neutral spine, learn how to manage pain, and identify ways to improve their physical movement in order to promote healthy living. The purpose of this lesson is to expose patients to common physical limitations that impact physical activity. Participants will learn practical ways to adapt and manage aches and pains, and to minimize their effect on regular exercise. Patients will learn how to maintain good posture while sitting, walking, and lifting.  Balance Training and Fall Prevention  Clinical staff led group instruction and group discussion with PowerPoint presentation and patient guidebook. To enhance the learning environment the use of posters, models and videos may be added. At the  conclusion of this workshop, patients will understand the importance of their sensorimotor skills (vision, proprioception, and the vestibular system) in maintaining their ability to balance as they age. Patients will apply a variety of balancing exercises that are appropriate for their current level of function. Patients will understand the common causes for poor balance, possible solutions to these problems, and ways to modify their physical environment in order to minimize their fall risk. The purpose of this lesson is to teach patients about the importance of maintaining balance as they age and ways to minimize their risk of falling.  WORKSHOPS   Nutrition:  Fueling a Ship broker led group instruction and group discussion with PowerPoint presentation and patient guidebook. To enhance the learning environment the use of posters, models and videos may be added. Patients will review the foundational principles of the Pritikin Eating Plan and understand what constitutes a serving size in each of the food groups. Patients will also learn Pritikin-friendly foods that are better choices when away from home and review make-ahead meal and snack options. Calorie density will be reviewed and applied to three nutrition priorities: weight maintenance, weight loss, and weight gain. The purpose of this lesson is to reinforce (in a group setting) the key concepts around what patients are recommended to eat and how to apply these guidelines when away from home by planning and selecting Pritikin-friendly options. Patients will understand how calorie density may be adjusted for different weight management goals.  Mindful Eating  Clinical staff led group instruction and group discussion with PowerPoint presentation and patient guidebook. To enhance the learning environment the use of posters, models and videos may be added.  Patients will briefly review the concepts of the Pritikin Eating Plan and the  importance of low-calorie dense foods. The concept of mindful eating will be introduced as well as the importance of paying attention to internal hunger signals. Triggers for non-hunger eating and techniques for dealing with triggers will be explored. The purpose of this lesson is to provide patients with the opportunity to review the basic principles of the Pritikin Eating Plan, discuss the value of eating mindfully and how to measure internal cues of hunger and fullness using the Hunger Scale. Patients will also discuss reasons for non-hunger eating and learn strategies to use for controlling emotional eating.  Targeting Your Nutrition Priorities Clinical staff led group instruction and group discussion with PowerPoint presentation and patient guidebook. To enhance the learning environment the use of posters, models and videos may be added. Patients will learn how to determine their genetic susceptibility to disease by reviewing their family history. Patients will gain insight into the importance of diet as part of an overall healthy lifestyle in mitigating the impact of genetics and other environmental insults. The purpose of this lesson is to provide patients with the opportunity to assess their personal nutrition priorities by looking at their family history, their own health history and current risk factors. Patients will also be able to discuss ways of prioritizing and modifying the Pritikin Eating Plan for their highest risk areas  Menu  Clinical staff led group instruction and group discussion with PowerPoint presentation and patient guidebook. To enhance the learning environment the use of posters, models and videos may be added. Using menus brought in from [employer], or printed from [employer], patients will apply the Pritikin dining out guidelines that were presented in the [employer] video. Patients will also be able to practice these guidelines in a variety of  provided scenarios. The purpose of this lesson is to provide patients with the opportunity to practice hands-on learning of the Pritikin Dining Out guidelines with actual menus and practice scenarios.  Label Reading Clinical staff led group instruction and group discussion with PowerPoint presentation and patient guidebook. To enhance the learning environment the use of posters, models and videos may be added. Patients will review and discuss the Pritikin label reading guidelines presented in Pritikin's Label Reading Educational series video. Using fool labels brought in from local grocery stores and markets, patients will apply the label reading guidelines and determine if the packaged food meet the Pritikin guidelines. The purpose of this lesson is to provide patients with the opportunity to review, discuss, and practice hands-on learning of the Pritikin Label Reading guidelines with actual packaged food labels. Cooking School  Pritikin's [employer] are designed to teach patients ways to prepare quick, simple, and affordable recipes at home. The importance of nutrition's role in chronic disease risk reduction is reflected in its emphasis in the overall Pritikin program. By learning how to prepare essential core Pritikin Eating Plan recipes, patients will increase control over what they eat; be able to customize the flavor of foods without the use of added salt, sugar, or fat; and improve the quality of the food they consume. By learning a set of core recipes which are easily assembled, quickly prepared, and affordable, patients are more likely to prepare more healthy foods at home. These workshops focus on convenient breakfasts, simple entres, side dishes, and desserts which can be prepared with minimal effort and are consistent with nutrition recommendations for cardiovascular risk reduction. Cooking [employer] are  taught by a chef or registered dietitian (RD) who has been trained by the  [facility] team. The chef or RD has a clear understanding of the importance of minimizing - if not completely eliminating - added fat, sugar, and sodium in recipes. Throughout the series of Cooking School Workshop sessions, patients will learn about healthy ingredients and efficient methods of cooking to build confidence in their capability to prepare    Cooking School weekly topics:  Adding Flavor- Sodium-Free  Fast and Healthy Breakfasts  Powerhouse Plant-Based Proteins  Satisfying Salads and Dressings  Simple Sides and Sauces  International Cuisine-Spotlight on the [employer] Zones  Delicious Desserts  Savory Soups  [employer] - Meals in a Astronomer Appetizers and Snacks  Comforting Weekend Breakfasts  One-Pot Wonders   Fast Evening Meals  Landscape architect Your Pritikin Plate  WORKSHOPS   Healthy Mindset (Psychosocial):  Focused Goals, Sustainable Changes Clinical staff led group instruction and group discussion with PowerPoint presentation and patient guidebook. To enhance the learning environment the use of posters, models and videos may be added. Patients will be able to apply effective goal setting strategies to establish at least one personal goal, and then take consistent, meaningful action toward that goal. They will learn to identify common barriers to achieving personal goals and develop strategies to overcome them. Patients will also gain an understanding of how our mind-set can impact our ability to achieve goals and the importance of cultivating a positive and growth-oriented mind-set. The purpose of this lesson is to provide patients with a deeper understanding of how to set and achieve personal goals, as well as the tools and strategies needed to overcome common obstacles which may arise along the way.  From Head to Heart: The Power of a Healthy Outlook  Clinical staff led group instruction and group discussion with PowerPoint presentation  and patient guidebook. To enhance the learning environment the use of posters, models and videos may be added. Patients will be able to recognize and describe the impact of emotions and mood on physical health. They will discover the importance of self-care and explore self-care practices which may work for them. Patients will also learn how to utilize the 4 C's to cultivate a healthier outlook and better manage stress and challenges. The purpose of this lesson is to demonstrate to patients how a healthy outlook is an essential part of maintaining good health, especially as they continue their cardiac rehab journey.  Healthy Sleep for a Healthy Heart Clinical staff led group instruction and group discussion with PowerPoint presentation and patient guidebook. To enhance the learning environment the use of posters, models and videos may be added. At the conclusion of this workshop, patients will be able to demonstrate knowledge of the importance of sleep to overall health, well-being, and quality of life. They will understand the symptoms of, and treatments for, common sleep disorders. Patients will also be able to identify daytime and nighttime behaviors which impact sleep, and they will be able to apply these tools to help manage sleep-related challenges. The purpose of this lesson is to provide patients with a general overview of sleep and outline the importance of quality sleep. Patients will learn about a few of the most common sleep disorders. Patients will also be introduced to the concept of "sleep hygiene," and discover ways to self-manage certain sleeping problems through simple daily behavior changes. Finally, the workshop will motivate patients by clarifying the links between quality sleep and their goals  of heart-healthy living.   Recognizing and Reducing Stress Clinical staff led group instruction and group discussion with PowerPoint presentation and patient guidebook. To enhance the learning  environment the use of posters, models and videos may be added. At the conclusion of this workshop, patients will be able to understand the types of stress reactions, differentiate between acute and chronic stress, and recognize the impact that chronic stress has on their health. They will also be able to apply different coping mechanisms, such as reframing negative self-talk. Patients will have the opportunity to practice a variety of stress management techniques, such as deep abdominal breathing, progressive muscle relaxation, and/or guided imagery.  The purpose of this lesson is to educate patients on the role of stress in their lives and to provide healthy techniques for coping with it.  Learning Barriers/Preferences:  Learning Barriers/Preferences - 11/17/22 1014       Learning Barriers/Preferences   Learning Barriers None    Learning Preferences Audio;Skilled Demonstration;Computer/Internet;Verbal Instruction;Group Instruction;Video;Individual Instruction;Written Material;Pictoral             Education Topics:  Knowledge Questionnaire Score:  Knowledge Questionnaire Score - 11/17/22 0902       Knowledge Questionnaire Score   Pre Score 23/24             Core Components/Risk Factors/Patient Goals at Admission:  Personal Goals and Risk Factors at Admission - 11/17/22 1015       Core Components/Risk Factors/Patient Goals on Admission    Weight Management Yes    Intervention Weight Management: Develop a combined nutrition and exercise program designed to reach desired caloric intake, while maintaining appropriate intake of nutrient and fiber, sodium and fats, and appropriate energy expenditure required for the weight goal.;Weight Management: Provide education and appropriate resources to help participant work on and attain dietary goals.    Expected Outcomes Weight Maintenance: Understanding of the daily nutrition guidelines, which includes 25-35% calories from fat, 7% or less  cal from saturated fats, less than 200mg  cholesterol, less than 1.5gm of sodium, & 5 or more servings of fruits and vegetables daily;Understanding recommendations for meals to include 15-35% energy as protein, 25-35% energy from fat, 35-60% energy from carbohydrates, less than 200mg  of dietary cholesterol, 20-35 gm of total fiber daily;Understanding of distribution of calorie intake throughout the day with the consumption of 4-5 meals/snacks    Heart Failure Yes    Intervention Provide a combined exercise and nutrition program that is supplemented with education, support and counseling about heart failure. Directed toward relieving symptoms such as shortness of breath, decreased exercise tolerance, and extremity edema.    Expected Outcomes Improve functional capacity of life;Short term: Attendance in program 2-3 days a week with increased exercise capacity. Reported lower sodium intake. Reported increased fruit and vegetable intake. Reports medication compliance.;Short term: Daily weights obtained and reported for increase. Utilizing diuretic protocols set by physician.;Long term: Adoption of self-care skills and reduction of barriers for early signs and symptoms recognition and intervention leading to self-care maintenance.    Hypertension Yes    Intervention Provide education on lifestyle modifcations including regular physical activity/exercise, weight management, moderate sodium restriction and increased consumption of fresh fruit, vegetables, and low fat dairy, alcohol moderation, and smoking cessation.;Monitor prescription use compliance.    Expected Outcomes Short Term: Continued assessment and intervention until BP is < 140/76mm HG in hypertensive participants. < 130/65mm HG in hypertensive participants with diabetes, heart failure or chronic kidney disease.;Long Term: Maintenance of blood pressure at goal levels.  Lipids Yes    Intervention Provide education and support for participant on  nutrition & aerobic/resistive exercise along with prescribed medications to achieve LDL 70mg , HDL >40mg .    Expected Outcomes Short Term: Participant states understanding of desired cholesterol values and is compliant with medications prescribed. Participant is following exercise prescription and nutrition guidelines.;Long Term: Cholesterol controlled with medications as prescribed, with individualized exercise RX and with personalized nutrition plan. Value goals: LDL < 70mg , HDL > 40 mg.    Stress Yes    Intervention Offer individual and/or small group education and counseling on adjustment to heart disease, stress management and health-related lifestyle change. Teach and support self-help strategies.;Refer participants experiencing significant psychosocial distress to appropriate mental health specialists for further evaluation and treatment. When possible, include family members and significant others in education/counseling sessions.    Expected Outcomes Short Term: Participant demonstrates changes in health-related behavior, relaxation and other stress management skills, ability to obtain effective social support, and compliance with psychotropic medications if prescribed.;Long Term: Emotional wellbeing is indicated by absence of clinically significant psychosocial distress or social isolation.             Core Components/Risk Factors/Patient Goals Review:   Goals and Risk Factor Review     Row Name 11/27/22 1624 12/15/22 1737           Core Components/Risk Factors/Patient Goals Review   Personal Goals Review Weight Management/Obesity;Stress;Heart Failure;Hypertension;Lipids Weight Management/Obesity;Stress;Heart Failure;Hypertension;Lipids      Review Deroy started intensive cardiac rehab on 11/26/22 and did well with exercise. Vital signs were stable. Shaheed is doing  well with exercise at cardiac rehab. Vital signs have been  stable.      Expected Outcomes Yonah will continue to  participate in intensive cardiac rehab for exercise, nutrition and lifestyle modifications. Iva will continue to participate in intensive cardiac rehab for exercise, nutrition and lifestyle modifications.               Core Components/Risk Factors/Patient Goals at Discharge (Final Review):   Goals and Risk Factor Review - 12/15/22 1737       Core Components/Risk Factors/Patient Goals Review   Personal Goals Review Weight Management/Obesity;Stress;Heart Failure;Hypertension;Lipids    Review Sisto is doing  well with exercise at cardiac rehab. Vital signs have been  stable.    Expected Outcomes Lawsyn will continue to participate in intensive cardiac rehab for exercise, nutrition and lifestyle modifications.             ITP Comments:  ITP Comments     Row Name 11/17/22 0819 12/15/22 1733         ITP Comments [name], MD: Medical Director.  Introduction to the [facility] / Intensive Cardiac Rehab.  Initial orientation packet reviewed with the patient. 30 Day ITP Review. Meridith has good participation and is off to a good start to exercise at intensive cardiac rehab               Comments: See ITP comments.

## 2022-12-16 ENCOUNTER — Encounter (HOSPITAL_COMMUNITY)
Admission: RE | Admit: 2022-12-16 | Discharge: 2022-12-16 | Disposition: A | Discharge status: Home / Self Care | Payer: BC Managed Care – PPO | Source: Ambulatory Visit | Attending: Cardiology | Admitting: Cardiology

## 2022-12-16 DIAGNOSIS — Z5189 Encounter for other specified aftercare: Secondary | ICD-10-CM | POA: Diagnosis not present

## 2022-12-16 DIAGNOSIS — I2111 ST elevation (STEMI) myocardial infarction involving right coronary artery: Secondary | ICD-10-CM

## 2022-12-16 DIAGNOSIS — Z955 Presence of coronary angioplasty implant and graft: Secondary | ICD-10-CM

## 2022-12-16 NOTE — Progress Notes (Signed)
Reviewed home exercise Rx with patient today.  Encouraged warm-up, cool-down, and stretching. Reviewed THRR of 68-137 and keeping RPE between 11-13. Encouraged to hydrate with activity.  Reviewed weather parameters for temperature and humidity for safe exercise outdoors. Reviewed S/S to terminate exercise and when to call 911 vs MD. Reviewed the use of NTG and pt was encouraged to carry at all times. Pt encouraged to always carry a cell phone for safety when exercising outdoors. Pt verbalized understanding of the home exercise Rx and was provided a copy.   Lorin Picket MS, ACSM-CEP, CCRP

## 2022-12-18 ENCOUNTER — Encounter (HOSPITAL_COMMUNITY)
Admission: RE | Admit: 2022-12-18 | Discharge: 2022-12-18 | Disposition: A | Discharge status: Home / Self Care | Payer: BC Managed Care – PPO | Source: Ambulatory Visit | Attending: Cardiology | Admitting: Cardiology

## 2022-12-18 DIAGNOSIS — I2111 ST elevation (STEMI) myocardial infarction involving right coronary artery: Secondary | ICD-10-CM

## 2022-12-18 DIAGNOSIS — Z5189 Encounter for other specified aftercare: Secondary | ICD-10-CM | POA: Diagnosis not present

## 2022-12-18 DIAGNOSIS — Z955 Presence of coronary angioplasty implant and graft: Secondary | ICD-10-CM

## 2022-12-21 ENCOUNTER — Encounter (HOSPITAL_COMMUNITY): Payer: BC Managed Care – PPO

## 2022-12-22 ENCOUNTER — Encounter: Payer: Self-pay | Admitting: Cardiology

## 2022-12-23 ENCOUNTER — Encounter (HOSPITAL_COMMUNITY): Payer: BC Managed Care – PPO

## 2022-12-25 ENCOUNTER — Encounter (HOSPITAL_COMMUNITY): Payer: BC Managed Care – PPO

## 2022-12-28 ENCOUNTER — Encounter (HOSPITAL_COMMUNITY): Payer: BC Managed Care – PPO

## 2022-12-30 ENCOUNTER — Encounter (HOSPITAL_COMMUNITY): Payer: BC Managed Care – PPO

## 2023-01-01 ENCOUNTER — Encounter (HOSPITAL_COMMUNITY): Payer: BC Managed Care – PPO

## 2023-01-03 NOTE — Progress Notes (Unsigned)
Cardiology Office Note:    Date:  01/05/2023   ID:  Craig Macias, DOB 09-06-1970, MRN [id number]  PCP:  Farris Has, MD  Cardiologist:  Little Ishikawa, MD  Electrophysiologist:  None   Referring MD: Farris Has, MD   Chief Complaint  Patient presents with   Coronary Artery Disease    History of Present Illness:    Craig Macias is a 52 y.o. male with a hx of CAD status post STEMI 10/2022 with PCI to RPDA, fatty liver disease, chronic thrombocytopenia who presents for follow-up.  Patient developed sudden onset of chest pain on evening of 10/25/2022, reported 10 out of 10 chest pain that lasted for 4 hours.  He did not seek medical attention at that time.  He presented to his PCP's office 5/14 for annual physical and EKG showed ST elevations so he was transported to the ED via EMS. EKG in ED showed normal sinus rhythm with Q waves and mild ST elevation in inferior leads and leads V3-V6.  This was reviewed with interventional cardiology and suspected late presentation of STEMI, so STEMI alert was not activated.  High-sensitivity troponin downtrending 11, 563 >> 11,386.   Echo showed EF 50 to 55%, normal RV function, no significant valvular disease.  Plan was for cath on 5/15, but patient developed acute chest pain around 6 AM on 5/15 and worsening ST elevations on EKG.  STEMI alert activated, found to have occluded RPDA 3, underwent successful PCI with DES.  Also noted to have 35% proximal mid RCA stenosis, 40% RPDA 1, 50% RPDA 2, 50% ramus, 40% D1.  EF appeared 45 to 50% on cath.  Repeat echo 10/29/22 showed EF 40-45%.  Since last clinic visit, he reports he is doing well.  Denies any chest pain.  Continues to have some shortness of breath but not limiting his activities.  He has been doing cardiac rehab, reports no issues.  Reports some lightheadedness but denies any syncope.  No lower extremity edema.  He does report has been having palpitations recently where he feels like heart is  racing, lasts for about a minute or so.  Reports BP has been 100 over 70s when he checks at home.  He denies any bleeding issues.   Past Medical History:  Diagnosis Date   Coronary artery disease    Hyperlipidemia    Hypertension     Past Surgical History:  Procedure Laterality Date   CARDIAC CATHETERIZATION     CORONARY/GRAFT ACUTE MI REVASCULARIZATION N/A 10/28/2022   Procedure: Coronary/Graft Acute MI Revascularization;  Surgeon: Marykay Lex, MD;  Location: [facility] INVASIVE CV LAB;  Service: Cardiovascular;  Laterality: N/A;   LEFT HEART CATH AND CORONARY ANGIOGRAPHY N/A 10/28/2022   Procedure: LEFT HEART CATH AND CORONARY ANGIOGRAPHY;  Surgeon: Marykay Lex, MD;  Location: [facility] INVASIVE CV LAB;  Service: Cardiovascular;  Laterality: N/A;   VASECTOMY  09/2015    Current Medications: Current Meds  Medication Sig   ASCORBIC ACID PO Take 1 tablet by mouth daily. Vitamin C, unknown strength.   aspirin EC 81 MG tablet Take 1 tablet (81 mg total) by mouth daily. Swallow whole.   Evolocumab (REPATHA SURECLICK) 140 MG/ML SOAJ Inject 140 mg into the skin every 14 (fourteen) days.   fexofenadine (ALLEGRA) 180 MG tablet Take 180 mg by mouth daily.   losartan (COZAAR) 25 MG tablet Take 1 tablet (25 mg total) by mouth daily.   MAGNESIUM OXIDE PO Take 1 tablet by mouth  at bedtime.   metoprolol succinate (TOPROL-XL) 25 MG 24 hr tablet Take 1 tablet (25 mg total) by mouth daily.   Multiple Vitamin (MULTIVITAMIN ADULT PO) 1 tablet Orally once a day   nitroGLYCERIN (NITROSTAT) 0.4 MG SL tablet Place 1 tablet (0.4 mg total) under the tongue every 5 (five) minutes x 3 doses as needed for chest pain.   ticagrelor (BRILINTA) 90 MG TABS tablet Take 1 tablet (90 mg total) by mouth 2 (two) times daily.     Allergies:   Atorvastatin, Amoxil [amoxicillin], Biaxin [clarithromycin], and Penicillins   Social History   Socioeconomic History   Marital status: Married    Spouse name: Not on file    Number of children: Not on file   Years of education: Not on file   Highest education level: Bachelor's degree (e.g., BA, AB, BS)  Occupational History   Not on file  Tobacco Use   Smoking status: Former    Types: Cigarettes   Smokeless tobacco: Former   Tobacco comments:    Financial trader   Vaping status: Never Used  Substance and Sexual Activity   Alcohol use: Not Currently    Alcohol/week: 1.0 standard drink of alcohol    Types: 1 Cans of beer per week    Comment: occasional   Drug use: Not Currently    Types: Marijuana    Comment: occasional   Sexual activity: Yes    Birth control/protection: None  Other Topics Concern   Not on file  Social History Narrative   Not on file   Social Determinants of Health   Financial Resource Strain: Low Risk  (10/28/2022)   Overall Financial Resource Strain (CARDIA)    Difficulty of Paying Living Expenses: Not hard at all  Food Insecurity: No Food Insecurity (10/28/2022)   Hunger Vital Sign    Worried About Running Out of Food in the Last Year: Never true    Ran Out of Food in the Last Year: Never true  Transportation Needs: No Transportation Needs (10/28/2022)   PRAPARE - Administrator, Civil Service (Medical): No    Lack of Transportation (Non-Medical): No  Physical Activity: Not on file  Stress: Not on file  Social Connections: Not on file     Family History: The patient's family history includes Cancer in his father; Colitis in his mother; Heart attack in his paternal grandfather; Stroke in his maternal grandfather.  ROS:   Please see the history of present illness.     All other systems reviewed and are negative.  EKGs/Labs/Other Studies Reviewed:    The following studies were reviewed today:   EKG:   11/06/2022: Normal sinus rhythm, rate 68, persistent ST elevations in inferior leads and V3-6; appears unchanged from post PCI EKG on 10/28/2022  Recent Labs: 11/06/2022: ALT 129; BUN 17; Creatinine, Ser  0.95; Hemoglobin 14.2; Platelets 225; Potassium 4.6; Sodium 139  Recent Lipid Panel    Component Value Date/Time   CHOL 148 10/28/2022 0940   TRIG 49 10/28/2022 0940   HDL 58 10/28/2022 0940   CHOLHDL 2.6 10/28/2022 0940   VLDL 10 10/28/2022 0940   LDLCALC 80 10/28/2022 0940    Physical Exam:    VS:  BP 106/70 (BP Location: Right Arm, Patient Position: Sitting, Cuff Size: Normal)   Pulse 76   Ht 6\' 1"  (1.854 m)   Wt 210 lb 9.6 oz (95.5 kg)   SpO2 99%   BMI 27.79 kg/m  Wt Readings from Last 3 Encounters:  01/05/23 210 lb 9.6 oz (95.5 kg)  11/17/22 212 lb 15.4 oz (96.6 kg)  11/06/22 212 lb 3.2 oz (96.3 kg)     GEN:  Well nourished, well developed in no acute distress HEENT: Normal NECK: No JVD; No carotid bruits LYMPHATICS: No lymphadenopathy CARDIAC: RRR, no murmurs, rubs, gallops RESPIRATORY:  Clear to auscultation without rales, wheezing or rhonchi  ABDOMEN: Soft, non-tender, non-distended MUSCULOSKELETAL:  No edema; No deformity  SKIN: Warm and dry NEUROLOGIC:  Alert and oriented x 3 PSYCHIATRIC:  Normal affect   ASSESSMENT:    1. Coronary artery disease involving native coronary artery of native heart without angina pectoris   2. Acute systolic heart failure (HCC)   3. Hyperlipidemia, unspecified hyperlipidemia type   4. Palpitations     PLAN:    CAD: Patient developed sudden onset of chest pain on evening of 10/25/2022, reported 10 out of 10 chest pain that lasted for 4 hours.  He presented to his PCP's office 5/14 for annual physical and EKG showed ST elevations so he was transported to the ED via EMS. EKG here shows normal sinus rhythm with Q waves and mild ST elevation in inferior leads and leads V3-V6.  This was reviewed with interventional cardiology and suspected late presentation of STEMI, so STEMI alert was not activated.  High-sensitivity troponin downtrending 11, 563 >> 11,386.   Echo shows EF 50 to 55%, normal RV function, no significant valvular  disease.  Plan was for cath on 5/15, but patient developed acute chest pain around 6 AM on 5/15 and worsening ST elevations on EKG.  STEMI alert activated, found to have occluded RPDA 3, underwent successful PCI with DES.  Also noted to have 35% proximal mid RCA stenosis, 40% RPDA 1, 50% RPDA 2, 50% ramus, 40% D1.  EF appeared 45 to 50% on cath.  Repeat echo 10/29/22 showed EF 40-45% - Continue ASA 81 mg daily, ticagrelor 90 mg twice daily - Started atorvastatin 80 mg daily.  Discontinued statin due to worsening LFTs.  Referred to pharmacy lipid clinic and started Repatha.  Check lipid panel next month - Continue toprol XL 25 mg daily   Acute systolic heart failure: due to MI.  Echo 10/29/22 showed EF 40-45% -Continue toprol XL 25 mg daily -Continue losartan 25 mg daily -Will hold off on additional GDMT at this time due to soft BP -Plan repeat echocardiogram next month  Palpitations: Description concerning for arrhythmia, evaluate with Zio patch x 7 days   Chronic Thrombocytopenia: history of chronic thrombocytopenia with baseline platelets in the 90s to low 100s.  Has been stable.  Check CBC when check labs next month   Transaminitis: mild, has history of fatty liver disease.  Worsened with starting statin, switch to Repatha as above.  Check CMET when check labs next month  RTC in 3 months    Medication Adjustments/Labs and Tests Ordered: Current medicines are reviewed at length with the patient today.  Concerns regarding medicines are outlined above.  Orders Placed This Encounter  Procedures   Lipid panel   Comprehensive Metabolic Panel (CMET)   CBC w/Diff/Platelet   ECHOCARDIOGRAM COMPLETE   No orders of the defined types were placed in this encounter.   Patient Instructions  Medication Instructions:  Continue same medications *If you need a refill on your cardiac medications before your next appointment, please call your pharmacy*   Lab Work: Have cbc,lipid panel,cmet in 1  month around 8/23  Testing/Procedures: Schedule Echo in mid August  7day heart monitor ( ZIO ) will be mailed to your home   Follow-Up: At [facility], you and your health needs are our priority.  As part of our continuing mission to provide you with exceptional heart care, we have created designated Provider Care Teams.  These Care Teams include your primary Cardiologist (physician) and Advanced Practice Providers (APPs -  Physician Assistants and Nurse Practitioners) who all work together to provide you with the care you need, when you need it.  We recommend signing up for the patient portal called "MyChart".  Sign up information is provided on this After Visit Summary.  MyChart is used to connect with patients for Virtual Visits (Telemedicine).  Patients are able to view lab/test results, encounter notes, upcoming appointments, etc.  Non-urgent messages can be sent to your provider as well.   To learn more about what you can do with MyChart, go to [email or web address].    Your next appointment:  3 months    Provider:  Dr.Tri Chittick     Signed, Little Ishikawa, MD  01/05/2023 1:07 PM    [facility]

## 2023-01-04 ENCOUNTER — Encounter (HOSPITAL_COMMUNITY)
Admission: RE | Admit: 2023-01-04 | Discharge: 2023-01-04 | Disposition: A | Discharge status: Home / Self Care | Payer: BC Managed Care – PPO | Source: Ambulatory Visit | Attending: Cardiology | Admitting: Cardiology

## 2023-01-04 DIAGNOSIS — I2111 ST elevation (STEMI) myocardial infarction involving right coronary artery: Secondary | ICD-10-CM

## 2023-01-04 DIAGNOSIS — Z5189 Encounter for other specified aftercare: Secondary | ICD-10-CM | POA: Diagnosis not present

## 2023-01-04 DIAGNOSIS — Z955 Presence of coronary angioplasty implant and graft: Secondary | ICD-10-CM

## 2023-01-05 ENCOUNTER — Ambulatory Visit: Payer: BC Managed Care – PPO | Attending: Cardiology

## 2023-01-05 ENCOUNTER — Ambulatory Visit: Payer: BC Managed Care – PPO | Attending: Cardiology | Admitting: Cardiology

## 2023-01-05 ENCOUNTER — Encounter: Payer: Self-pay | Admitting: Cardiology

## 2023-01-05 ENCOUNTER — Other Ambulatory Visit: Payer: Self-pay | Admitting: Cardiology

## 2023-01-05 VITALS — BP 106/70 | HR 76 | Ht 73.0 in | Wt 210.6 lb

## 2023-01-05 DIAGNOSIS — E785 Hyperlipidemia, unspecified: Secondary | ICD-10-CM

## 2023-01-05 DIAGNOSIS — I5021 Acute systolic (congestive) heart failure: Secondary | ICD-10-CM

## 2023-01-05 DIAGNOSIS — R002 Palpitations: Secondary | ICD-10-CM

## 2023-01-05 DIAGNOSIS — I2510 Atherosclerotic heart disease of native coronary artery without angina pectoris: Secondary | ICD-10-CM

## 2023-01-05 DIAGNOSIS — I213 ST elevation (STEMI) myocardial infarction of unspecified site: Secondary | ICD-10-CM

## 2023-01-05 NOTE — Progress Notes (Unsigned)
Enrolled for [employer] to mail a ZIO XT long term holter monitor to the patients address on file.  

## 2023-01-05 NOTE — Patient Instructions (Signed)
Medication Instructions:  Continue same medications *If you need a refill on your cardiac medications before your next appointment, please call your pharmacy*   Lab Work: Have cbc,lipid panel,cmet in 1 month around 8/23   Testing/Procedures: Schedule Echo in mid August  7day heart monitor ( ZIO ) will be mailed to your home   Follow-Up: At [facility], you and your health needs are our priority.  As part of our continuing mission to provide you with exceptional heart care, we have created designated Provider Care Teams.  These Care Teams include your primary Cardiologist (physician) and Advanced Practice Providers (APPs -  Physician Assistants and Nurse Practitioners) who all work together to provide you with the care you need, when you need it.  We recommend signing up for the patient portal called "MyChart".  Sign up information is provided on this After Visit Summary.  MyChart is used to connect with patients for Virtual Visits (Telemedicine).  Patients are able to view lab/test results, encounter notes, upcoming appointments, etc.  Non-urgent messages can be sent to your provider as well.   To learn more about what you can do with MyChart, go to [email or web address].    Your next appointment:  3 months    Provider:  Dr.Schumann

## 2023-01-06 ENCOUNTER — Encounter (HOSPITAL_COMMUNITY)
Admission: RE | Admit: 2023-01-06 | Discharge: 2023-01-06 | Disposition: A | Discharge status: Home / Self Care | Payer: BC Managed Care – PPO | Source: Ambulatory Visit | Attending: Cardiology | Admitting: Cardiology

## 2023-01-06 DIAGNOSIS — Z955 Presence of coronary angioplasty implant and graft: Secondary | ICD-10-CM

## 2023-01-06 DIAGNOSIS — I2111 ST elevation (STEMI) myocardial infarction involving right coronary artery: Secondary | ICD-10-CM

## 2023-01-06 DIAGNOSIS — Z5189 Encounter for other specified aftercare: Secondary | ICD-10-CM | POA: Diagnosis not present

## 2023-01-08 ENCOUNTER — Encounter (HOSPITAL_COMMUNITY)
Admission: RE | Admit: 2023-01-08 | Discharge: 2023-01-08 | Disposition: A | Discharge status: Home / Self Care | Payer: BC Managed Care – PPO | Source: Ambulatory Visit | Attending: Cardiology | Admitting: Cardiology

## 2023-01-08 DIAGNOSIS — I5021 Acute systolic (congestive) heart failure: Secondary | ICD-10-CM | POA: Diagnosis not present

## 2023-01-08 DIAGNOSIS — R002 Palpitations: Secondary | ICD-10-CM

## 2023-01-08 DIAGNOSIS — I2111 ST elevation (STEMI) myocardial infarction involving right coronary artery: Secondary | ICD-10-CM

## 2023-01-08 DIAGNOSIS — Z955 Presence of coronary angioplasty implant and graft: Secondary | ICD-10-CM

## 2023-01-08 DIAGNOSIS — Z5189 Encounter for other specified aftercare: Secondary | ICD-10-CM | POA: Diagnosis not present

## 2023-01-11 ENCOUNTER — Encounter (HOSPITAL_COMMUNITY): Payer: BC Managed Care – PPO

## 2023-01-13 ENCOUNTER — Encounter (HOSPITAL_COMMUNITY): Payer: BC Managed Care – PPO

## 2023-01-13 NOTE — Progress Notes (Signed)
Cardiac Individual Treatment Plan  Patient Details  Name: Craig Macias MRN: [id number] Date of Birth: 12-Dec-1970 Referring Provider:   Flowsheet Row INTENSIVE CARDIAC REHAB ORIENT from 11/17/2022 in [facility]  Referring Provider Epifanio Lesches, MD       Initial Encounter Date:  Flowsheet Row INTENSIVE CARDIAC REHAB ORIENT from 11/17/2022 in [facility]  Date 11/17/22       Visit Diagnosis: 10/27/22 STEMI  10/28/22 Sp DES RPDA  Patient's Home Medications on Admission:  Current Outpatient Medications:    ASCORBIC ACID PO, Take 1 tablet by mouth daily. Vitamin C, unknown strength., Disp: , Rfl:    aspirin EC 81 MG tablet, Take 1 tablet (81 mg total) by mouth daily. Swallow whole., Disp: 90 tablet, Rfl: 1   Evolocumab (REPATHA SURECLICK) 140 MG/ML SOAJ, Inject 140 mg into the skin every 14 (fourteen) days., Disp: 6 mL, Rfl: 1   fexofenadine (ALLEGRA) 180 MG tablet, Take 180 mg by mouth daily., Disp: , Rfl:    losartan (COZAAR) 25 MG tablet, Take 1 tablet (25 mg total) by mouth daily., Disp: 90 tablet, Rfl: 1   MAGNESIUM OXIDE PO, Take 1 tablet by mouth at bedtime., Disp: , Rfl:    metoprolol succinate (TOPROL-XL) 25 MG 24 hr tablet, Take 1 tablet (25 mg total) by mouth daily., Disp: 90 tablet, Rfl: 1   Multiple Vitamin (MULTIVITAMIN ADULT PO), 1 tablet Orally once a day, Disp: , Rfl:    nitroGLYCERIN (NITROSTAT) 0.4 MG SL tablet, Place 1 tablet (0.4 mg total) under the tongue every 5 (five) minutes x 3 doses as needed for chest pain., Disp: 25 tablet, Rfl: 1   pseudoephedrine (SUDAFED) 30 MG tablet, Take 30 mg by mouth 2 (two) times daily as needed for congestion. (Patient not taking: Reported on 01/05/2023), Disp: , Rfl:    ticagrelor (BRILINTA) 90 MG TABS tablet, Take 1 tablet (90 mg total) by mouth 2 (two) times daily., Disp: 180 tablet, Rfl: 1  Past Medical  History: Past Medical History:  Diagnosis Date   Coronary artery disease    Hyperlipidemia    Hypertension     Tobacco Use: Social History   Tobacco Use  Smoking Status Former   Types: Cigarettes  Smokeless Tobacco Former  Tobacco Comments   social    Labs: Review Flowsheet       Latest Ref Rng & Units 10/28/2022  Labs for ITP Cardiac and Pulmonary Rehab  Cholestrol 0 - 200 mg/dL 409   LDL (calc) 0 - 99 mg/dL 80   HDL-C >81 mg/dL 58   Trlycerides <191 mg/dL 49   Hemoglobin Y7W 4.8 - 5.6 % 4.8     Details            Capillary Blood Glucose: No results found for: "GLUCAP"   Exercise Target Goals: Exercise Program Goal: Individual exercise prescription set using results from initial 6 min walk test and THRR while considering  patient's activity barriers and safety.   Exercise Prescription Goal: Initial exercise prescription builds to 30-45 minutes a day of aerobic activity, 2-3 days per week.  Home exercise guidelines will be given to patient during program as part of exercise prescription that the participant will acknowledge.  Activity Barriers & Risk Stratification:  Activity Barriers & Cardiac Risk Stratification - 11/17/22 1009       Activity Barriers & Cardiac Risk Stratification   Activity  Barriers Deconditioning;Shortness of Breath    Cardiac Risk Stratification High             6 Minute Walk:  6 Minute Walk     Row Name 11/17/22 0930         6 Minute Walk   Phase Initial     Distance 1466 feet     Walk Time 6 minutes     # of Rest Breaks 0     MPH 2.8     METS 4.11     RPE 9     Perceived Dyspnea  0     VO2 Peak 14.4     Symptoms No     Resting HR 81 bpm     Resting BP 90/70     Resting Oxygen Saturation  100 %     Exercise Oxygen Saturation  during 6 min walk 98 %     Max Ex. HR 85 bpm     Max Ex. BP 110/70     2 Minute Post BP 100/60              Oxygen Initial Assessment:   Oxygen Re-Evaluation:   Oxygen  Discharge (Final Oxygen Re-Evaluation):   Initial Exercise Prescription:  Initial Exercise Prescription - 11/17/22 1000       Date of Initial Exercise RX and Referring Provider   Date 11/17/22    Referring Provider Epifanio Lesches, MD    Expected Discharge Date 01/29/23      Treadmill   MPH 3.2    Grade 0    Minutes 15    METs 3.45      Recumbant Elliptical   Level 2    RPM 60    Watts 25    Minutes 15    METs 4.1      Prescription Details   Frequency (times per week) 3    Duration Progress to 30 minutes of continuous aerobic without signs/symptoms of physical distress      Intensity   THRR 40-80% of Max Heartrate 68-137    Ratings of Perceived Exertion 11-13    Perceived Dyspnea 0-4      Progression   Progression Continue progressive overload as per policy without signs/symptoms or physical distress.      Resistance Training   Training Prescription Yes    Weight 5 lbs    Reps 10-15             Perform Capillary Blood Glucose checks as needed.  Exercise Prescription Changes:   Exercise Prescription Changes     Row Name 11/27/22 1500 12/16/22 1200 01/04/23 1505 01/05/23 0800       Response to Exercise   Blood Pressure (Admit) 108/70 110/70 110/70 --    Blood Pressure (Exercise) 158/82 136/80 132/72 --    Blood Pressure (Exit) 100/62 104/68 98/68 --    Heart Rate (Admit) 70 bpm 70 bpm 77 bpm --    Heart Rate (Exercise) 101 bpm 109 bpm 115 bpm --    Heart Rate (Exit) 68 bpm 85 bpm 86 bpm --    Rating of Perceived Exertion (Exercise) 11 12 10  --    Symptoms None None None --    Comments Pt's first day in the [facility] program Reviewed METs and home exercise Rx Reviewed METs and goals --    Duration Continue with 30 min of aerobic exercise without signs/symptoms of physical distress. Continue with 30 min of aerobic exercise without signs/symptoms of physical distress.  Continue with 30 min of aerobic exercise without signs/symptoms of physical distress. --     Intensity THRR unchanged THRR unchanged THRR unchanged --      Progression   Progression Continue to progress workloads to maintain intensity without signs/symptoms of physical distress. Continue to progress workloads to maintain intensity without signs/symptoms of physical distress. Continue to progress workloads to maintain intensity without signs/symptoms of physical distress. --    Average METs 3.5 4.48 4.8 --      Resistance Training   Training Prescription Yes No Yes --    Weight 5 lbs No weights on Wednesdays 8 lb wts --    Reps 10-15 -- 10-15 --    Time 10 Minutes -- 10 Minutes --      Interval Training   Interval Training No No No --      Treadmill   MPH 3.2 3.5 3.9 --    Grade 0 1 1 --    Minutes 15 15 15  --    METs 3.45 4.16 4.52 --      Recumbant Elliptical   Level -- 4 4 --    RPM 57 72 3 --    Watts 89 124 126 --    Minutes 15 15 15  --    METs 3.6 4.8 5.1 --      Home Exercise Plan   Plans to continue exercise at -- Home (comment) Home (comment) --    Frequency -- Add 4 additional days to program exercise sessions. Add 4 additional days to program exercise sessions. --    Initial Home Exercises Provided -- 12/16/22 12/16/22 --             Exercise Comments:   Exercise Comments     Row Name 11/27/22 1546 12/16/22 1213 12/24/22 0850 01/04/23 1510     Exercise Comments Pt's first day in the [facility] program. Pt exercise without complaints. Reviewed METS and home exercise Rx. Pt is making good progress. Pt currenly walks at home in the evenings and will continue to do so. Pt verbalized understanding of the home exercise Rx and was provided a copy. Pt due for MET/goals review on 7/12. Pt is out of town. Will review iupon patients return to the program. Reviewed METS and goals. Pt is making good progress, increased on treadmill today, will increase on Octance next session.             Exercise Goals and Review:   Exercise Goals     Row Name 11/17/22  1012             Exercise Goals   Increase Physical Activity Yes       Intervention Provide advice, education, support and counseling about physical activity/exercise needs.;Develop an individualized exercise prescription for aerobic and resistive training based on initial evaluation findings, risk stratification, comorbidities and participant's personal goals.       Expected Outcomes Short Term: Attend rehab on a regular basis to increase amount of physical activity.;Long Term: Add in home exercise to make exercise part of routine and to increase amount of physical activity.;Long Term: Exercising regularly at least 3-5 days a week.       Increase Strength and Stamina Yes       Intervention Provide advice, education, support and counseling about physical activity/exercise needs.;Develop an individualized exercise prescription for aerobic and resistive training based on initial evaluation findings, risk stratification, comorbidities and participant's personal goals.       Expected Outcomes Short Term:  Increase workloads from initial exercise prescription for resistance, speed, and METs.;Short Term: Perform resistance training exercises routinely during rehab and add in resistance training at home;Long Term: Improve cardiorespiratory fitness, muscular endurance and strength as measured by increased METs and functional capacity ( )       Able to understand and use rate of perceived exertion (RPE) scale Yes       Intervention Provide education and explanation on how to use RPE scale       Expected Outcomes Short Term: Able to use RPE daily in rehab to express subjective intensity level;Long Term:  Able to use RPE to guide intensity level when exercising independently       Knowledge and understanding of Target Heart Rate Range (THRR) Yes       Intervention Provide education and explanation of THRR including how the numbers were predicted and where they are located for reference       Expected  Outcomes Short Term: Able to state/look up THRR;Short Term: Able to use daily as guideline for intensity in rehab;Long Term: Able to use THRR to govern intensity when exercising independently       Understanding of Exercise Prescription Yes       Intervention Provide education, explanation, and written materials on patient's individual exercise prescription       Expected Outcomes Short Term: Able to explain program exercise prescription;Long Term: Able to explain home exercise prescription to exercise independently                Exercise Goals Re-Evaluation :  Exercise Goals Re-Evaluation     Row Name 11/27/22 1545 01/04/23 1508           Exercise Goal Re-Evaluation   Exercise Goals Review Increase Physical Activity;Increase Strength and Stamina;Able to understand and use rate of perceived exertion (RPE) scale;Knowledge and understanding of Target Heart Rate Range (THRR);Understanding of Exercise Prescription Increase Physical Activity;Increase Strength and Stamina;Able to understand and use rate of perceived exertion (RPE) scale;Knowledge and understanding of Target Heart Rate Range (THRR);Understanding of Exercise Prescription      Comments Pt's first day in the [facility] program. Pt understands the exercise Rx, RPE scale and THRR. Reviewed METs and goals. Pt voices he is making progress on his goal of increased strength and stamina, voices feeling like "himself" agian. Pt is back to play golf. Has not returned to the gym but will do so soon. Still has some SOB, will talk with MD at tomorrows appt.      Expected Outcomes Will continue to monitor patient and progress exercise workloads as tolerated. Will continue to monitor patient and progress exercise workloads as tolerated.               Discharge Exercise Prescription (Final Exercise Prescription Changes):  Exercise Prescription Changes - 01/05/23 0800       Response to Exercise   Blood Pressure (Admit) --    Blood Pressure  (Exercise) --    Blood Pressure (Exit) --    Heart Rate (Admit) --    Heart Rate (Exercise) --    Heart Rate (Exit) --    Rating of Perceived Exertion (Exercise) --    Symptoms --    Comments --    Duration --    Intensity --      Progression   Progression --    Average METs --             Nutrition:  Target Goals: Understanding of nutrition guidelines, daily  intake of sodium 1500mg , cholesterol 200mg , calories 30% from fat and 7% or less from saturated fats, daily to have 5 or more servings of fruits and vegetables.  Biometrics:  Pre Biometrics - 11/17/22 0818       Pre Biometrics   Waist Circumference 38 inches    Hip Circumference 42.5 inches    Waist to Hip Ratio 0.89 %    Triceps Skinfold 12 mm    % Body Fat 24.7 %    Grip Strength 38 kg    Flexibility 0 in   Pt could not reach   Single Leg Stand 30 seconds              Nutrition Therapy Plan and Nutrition Goals:  Nutrition Therapy & Goals - 12/25/22 1009       Nutrition Therapy   Diet Heart Healthy Diet      Personal Nutrition Goals   Nutrition Goal Patient to identify strategies for reducing cardiovascular risk by attending the [employer] education and nutrition series weekly.    Personal Goal #2 Patient to improve diet quality by using the plate method as a guide for meal planning to include lean protein/plant protein, fruits, vegetables, whole grains, nonfat dairy as part of a well-balanced diet.    Comments Goals in progress. Henning continues to attend the [employer] and nutrition series regularly. Statin was stopped due to elevated LFTs and repatha was started. Per documentation from oncology note on 12/14/17, LFTs have been elevated since at least 2018. Per imaging on 11/2018, consistent with hepatic steatosis fatty liver. Patient is down 1.5# since starting with our program. Patient will benefit from participation in intensive cardiac rehab for nutrition, exercise, and lifestyle modification.       Intervention Plan   Intervention Prescribe, educate and counsel regarding individualized specific dietary modifications aiming towards targeted core components such as weight, hypertension, lipid management, diabetes, heart failure and other comorbidities.;Nutrition handout(s) given to patient.    Expected Outcomes Short Term Goal: Understand basic principles of dietary content, such as calories, fat, sodium, cholesterol and nutrients.;Long Term Goal: Adherence to prescribed nutrition plan.             Nutrition Assessments:  MEDIFICTS Score Key: ?70 Need to make dietary changes  40-70 Heart Healthy Diet ? 40 Therapeutic Level Cholesterol Diet    Picture Your Plate Scores: <40 Unhealthy dietary pattern with much room for improvement. 41-50 Dietary pattern unlikely to meet recommendations for good health and room for improvement. 51-60 More healthful dietary pattern, with some room for improvement.  >60 Healthy dietary pattern, although there may be some specific behaviors that could be improved.    Nutrition Goals Re-Evaluation:  Nutrition Goals Re-Evaluation     Row Name 11/27/22 0943 12/25/22 1009           Goals   Current Weight 212 lb 15.4 oz (96.6 kg) 211 lb 6.7 oz (95.9 kg)      Comment A1c WNL, lipids WNL (LDL 80), per labs 6/9 at [facility] liver enzymes remain elevated AST 53, ALT 112 no new labs; most recent labs  A1c WNL, lipids WNL (LDL 80), per labs 6/9 at [facility] liver enzymes remain elevated AST 53, ALT 112      Expected Outcome Statin was stopped in May 2024 due to elevated LFTs and repatha was started. Per documentation from oncology note on 12/14/17, LFTs have been elevated since at least 2018. Per imaging on 11/2018, consistent with hepatic steatosis/ fatty  liver. Patient will benefit from participation in intensive cardiac rehab for nutrition, exercise, and lifestyle modification. Goals in progress. Wallie continues to attend the [employer] and nutrition series regularly. Statin was stopped due to elevated LFTs and repatha was started. Per documentation from oncology note on 12/14/17, LFTs have been elevated since at least 2018. Per imaging on 11/2018, consistent with hepatic steatosis fatty liver. Patient is down 1.5# since starting with our program. Patient will benefit from participation in intensive cardiac rehab for nutrition, exercise, and lifestyle modification.               Nutrition Goals Re-Evaluation:  Nutrition Goals Re-Evaluation     Row Name 11/27/22 0943 12/25/22 1009           Goals   Current Weight 212 lb 15.4 oz (96.6 kg) 211 lb 6.7 oz (95.9 kg)      Comment A1c WNL, lipids WNL (LDL 80), per labs 6/9 at [facility] liver enzymes remain elevated AST 53, ALT 112 no new labs; most recent labs  A1c WNL, lipids WNL (LDL 80), per labs 6/9 at [facility] liver enzymes remain elevated AST 53, ALT 112      Expected Outcome Statin was stopped in May 2024 due to elevated LFTs and repatha was started. Per documentation from oncology note on 12/14/17, LFTs have been elevated since at least 2018. Per imaging on 11/2018, consistent with hepatic steatosis/ fatty liver. Patient will benefit from participation in intensive cardiac rehab for nutrition, exercise, and lifestyle modification. Goals in progress. Renford continues to attend the [employer] and nutrition series regularly. Statin was stopped due to elevated LFTs and repatha was started. Per documentation from oncology note on 12/14/17, LFTs have been elevated since at least 2018. Per imaging on 11/2018, consistent with hepatic steatosis fatty liver. Patient is down 1.5# since starting with our program. Patient will benefit from participation in intensive cardiac rehab for nutrition, exercise, and lifestyle modification.               Nutrition Goals Discharge (Final Nutrition Goals Re-Evaluation):  Nutrition Goals Re-Evaluation - 12/25/22 1009        Goals   Current Weight 211 lb 6.7 oz (95.9 kg)    Comment no new labs; most recent labs  A1c WNL, lipids WNL (LDL 80), per labs 6/9 at [facility] liver enzymes remain elevated AST 53, ALT 112    Expected Outcome Goals in progress. Jaceion continues to attend the [employer] and nutrition series regularly. Statin was stopped due to elevated LFTs and repatha was started. Per documentation from oncology note on 12/14/17, LFTs have been elevated since at least 2018. Per imaging on 11/2018, consistent with hepatic steatosis fatty liver. Patient is down 1.5# since starting with our program. Patient will benefit from participation in intensive cardiac rehab for nutrition, exercise, and lifestyle modification.             Psychosocial: Target Goals: Acknowledge presence or absence of significant depression and/or stress, maximize coping skills, provide positive support system. Participant is able to verbalize types and ability to use techniques and skills needed for reducing stress and depression.  Initial Review & Psychosocial Screening:  Initial Psych Review & Screening - 11/17/22 1137       Initial Review   Current issues with --             Quality of Life Scores:  Quality of Life - 11/17/22 1147  Quality of Life Scores   Health/Function Pre 16.73 %    Socioeconomic Pre 20.93 %    Psych/Spiritual Pre 11.14 %    Family Pre 20.4 %    GLOBAL Pre 16.99 %            Scores of 19 and below usually indicate a poorer quality of life in these areas.  A difference of  2-3 points is a clinically meaningful difference.  A difference of 2-3 points in the total score of the Quality of Life Index has been associated with significant improvement in overall quality of life, self-image, physical symptoms, and general health in studies assessing change in quality of life.  PHQ-9: Review Flowsheet       11/17/2022  Depression screen PHQ 2/9  Decreased Interest 1  Down,  Depressed, Hopeless 1  PHQ - 2 Score 2  Altered sleeping 1  Tired, decreased energy 2  Change in appetite 0  Feeling bad or failure about yourself  1  Trouble concentrating 0  Moving slowly or fidgety/restless 0  Suicidal thoughts 0  PHQ-9 Score 6  Difficult doing work/chores Not difficult at all    Details           Interpretation of Total Score  Total Score Depression Severity:  1-4 = Minimal depression, 5-9 = Mild depression, 10-14 = Moderate depression, 15-19 = Moderately severe depression, 20-27 = Severe depression   Psychosocial Evaluation and Intervention:   Psychosocial Re-Evaluation:  Psychosocial Re-Evaluation     Row Name 11/27/22 1622 12/15/22 1736 01/13/23 1254         Psychosocial Re-Evaluation   Current issues with Current Depression Current Depression Current Depression     Comments Kailand said he did not plan to spend his summer recovering from a heart attack. Will review quality of life in the upcoming week Quality of life questionnare reviewed on 11/30/22. Jacolby just returned from being out of town with his girls at a volleyball tournament Parsa is currently out of town on vacation. Syer has not voiced any increased concerns when in attendance. Nur will complete cardiac rehab on 01/29/23     Expected Outcomes Lamarion will have decreased or controlled dperession upon completion of intensive cardiac rehab Tanor will have decreased or controlled dperession upon completion of intensive cardiac rehab Chuckie will have decreased or controlled dperession upon completion of intensive cardiac rehab     Interventions Encouraged to attend Cardiac Rehabilitation for the exercise;Stress management education;Relaxation education Encouraged to attend Cardiac Rehabilitation for the exercise;Stress management education;Relaxation education Encouraged to attend Cardiac Rehabilitation for the exercise;Stress management education;Relaxation education     Continue Psychosocial  Services  Follow up required by staff Follow up required by staff No Follow up required              Psychosocial Discharge (Final Psychosocial Re-Evaluation):  Psychosocial Re-Evaluation - 01/13/23 1254       Psychosocial Re-Evaluation   Current issues with Current Depression    Comments Billion is currently out of town on vacation. Hildon has not voiced any increased concerns when in attendance. Maan will complete cardiac rehab on 01/29/23    Expected Outcomes Tomar will have decreased or controlled dperession upon completion of intensive cardiac rehab    Interventions Encouraged to attend Cardiac Rehabilitation for the exercise;Stress management education;Relaxation education    Continue Psychosocial Services  No Follow up required             Vocational Rehabilitation: Provide vocational rehab  assistance to qualifying candidates.   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            Education: Education Goals: Education classes will be provided on a weekly basis, covering required topics. Participant will state understanding/return demonstration of topics presented.    Education     Row Name 11/27/22 0900     Education   Cardiac Education Topics [employer]   Select Core Videos     Core Videos   Educator Exercise Physiologist   Select Psychosocial   Psychosocial Healthy Minds, Bodies, Hearts   Instruction Review Code 1- Verbalizes Understanding   Class Start Time 0815   Class Stop Time 0850   Class Time Calculation (min) 35 min    Row Name 11/30/22 0900     Education   Cardiac Education Topics [employer]   Glass blower/designer Nutrition   Nutrition Workshop Label Reading    Instruction Review Code 1- Verbalizes Understanding   Class Start Time 0815   Class Stop Time 0858   Class Time Calculation (min) 43 min    Row Name 12/02/22 1400     Education   Cardiac Education Topics [employer]   Secondary school teacher School   Educator Dietitian   Weekly Topic Delicious Desserts   Instruction Review Code 1- Verbalizes Understanding   Class Start Time [id number]   Class Stop Time 0845   Class Time Calculation (min) 35 min    Row Name 12/14/22 1000     Education   Cardiac Education Topics [employer]   Select Core Videos     Core Videos   Educator Dietitian   Select Nutrition   Nutrition Calorie Density   Instruction Review Code 1- Verbalizes Understanding   Class Start Time 0815   Class Stop Time 0857   Class Time Calculation (min) 42 min    Row Name 12/16/22 0900     Education   Cardiac Education Topics [employer]   Secondary school teacher School   Educator Dietitian   Weekly Topic Efficiency Cooking - Meals in a Snap   Instruction Review Code 1- Verbalizes Understanding   Class Start Time 0815   Class Stop Time 0850   Class Time Calculation (min) 35 min    Row Name 12/16/22 1000     Education   Select --    Row Name 12/18/22 0800     Education   Cardiac Education Topics Pritikin   Psychologist, forensic Exercise Education   Exercise Education Move It!   Instruction Review Code 1- Verbalizes Understanding   Class Start Time [phone]   Class Stop Time 0844   Class Time Calculation (min) 33 min    Row Name 01/04/23 0900     Education   Cardiac Education Topics Pritikin   Select Core Videos     Core Videos   Educator Exercise Physiologist   Select Nutrition   Nutrition Facts on Fat   Instruction Review Code 1- Verbalizes Understanding   Class Start Time [phone]   Class Stop Time [phone]  Class Time Calculation (min) 32 min    Row Name 01/06/23 1000     Education    Cardiac Education Topics Pritikin   Secondary school teacher School   Educator Dietitian   Weekly Topic Fast Evening Meals   Instruction Review Code 1- Verbalizes Understanding   Class Start Time [phone]   Class Stop Time 0845   Class Time Calculation (min) 33 min    Row Name 01/08/23 0900     Education   Cardiac Education Topics Pritikin   Select Core Videos     Core Videos   Educator Dietitian   Select Nutrition   Nutrition Vitamins and Minerals   Instruction Review Code 1- Verbalizes Understanding   Class Start Time 0818   Class Stop Time 0857   Class Time Calculation (min) 39 min            Core Videos: Exercise    Move It!  Clinical staff conducted group or individual video education with verbal and written material and guidebook.  Patient learns the recommended Pritikin exercise program. Exercise with the goal of living a long, healthy life. Some of the health benefits of exercise include controlled diabetes, healthier blood pressure levels, improved cholesterol levels, improved heart and lung capacity, improved sleep, and better body composition. Everyone should speak with their doctor before starting or changing an exercise routine.  Biomechanical Limitations Clinical staff conducted group or individual video education with verbal and written material and guidebook.  Patient learns how biomechanical limitations can impact exercise and how we can mitigate and possibly overcome limitations to have an impactful and balanced exercise routine.  Body Composition Clinical staff conducted group or individual video education with verbal and written material and guidebook.  Patient learns that body composition (ratio of muscle mass to fat mass) is a key component to assessing overall fitness, rather than body weight alone. Increased fat mass, especially visceral belly fat, can put [place] at increased risk for metabolic syndrome, type 2 diabetes, heart disease, and even  death. It is recommended to combine diet and exercise (cardiovascular and resistance training) to improve your body composition. Seek guidance from your physician and exercise physiologist before implementing an exercise routine.  Exercise Action Plan Clinical staff conducted group or individual video education with verbal and written material and guidebook.  Patient learns the recommended strategies to achieve and enjoy long-term exercise adherence, including variety, self-motivation, self-efficacy, and positive decision making. Benefits of exercise include fitness, good health, weight management, more energy, better sleep, less stress, and overall well-being.  Medical   Heart Disease Risk Reduction Clinical staff conducted group or individual video education with verbal and written material and guidebook.  Patient learns our heart is our most vital organ as it circulates oxygen, nutrients, white blood cells, and hormones throughout the entire body, and carries waste away. Data supports a plant-based eating plan like the Pritikin Program for its effectiveness in slowing progression of and reversing heart disease. The video provides a number of recommendations to address heart disease.   Metabolic Syndrome and Belly Fat  Clinical staff conducted group or individual video education with verbal and written material and guidebook.  Patient learns what metabolic syndrome is, how it leads to heart disease, and how one can reverse it and keep it from coming back. You have metabolic syndrome if you have 3 of the following 5 criteria: abdominal obesity, high blood pressure, high triglycerides, low HDL cholesterol, and high blood sugar.  Hypertension and Heart Disease Clinical staff conducted group or individual video education with verbal and written material and guidebook.  Patient learns that high blood pressure, or hypertension, is very common in the [place]. Hypertension is largely due to  excessive salt intake, but other important risk factors include being overweight, physical inactivity, drinking too much alcohol, smoking, and not eating enough potassium from fruits and vegetables. High blood pressure is a leading risk factor for heart attack, stroke, congestive heart failure, dementia, kidney failure, and premature death. Long-term effects of excessive salt intake include stiffening of the arteries and thickening of heart muscle and organ damage. Recommendations include ways to reduce hypertension and the risk of heart disease.  Diseases of Our Time - Focusing on Diabetes Clinical staff conducted group or individual video education with verbal and written material and guidebook.  Patient learns why the best way to stop diseases of our time is prevention, through food and other lifestyle changes. Medicine (such as prescription pills and surgeries) is often only a Band-Aid on the problem, not a long-term solution. Most common diseases of our time include obesity, type 2 diabetes, hypertension, heart disease, and cancer. The Pritikin Program is recommended and has been proven to help reduce, reverse, and/or prevent the damaging effects of metabolic syndrome.  Nutrition   Overview of the Pritikin Eating Plan  Clinical staff conducted group or individual video education with verbal and written material and guidebook.  Patient learns about the Pritikin Eating Plan for disease risk reduction. The Pritikin Eating Plan emphasizes a wide variety of unrefined, minimally-processed carbohydrates, like fruits, vegetables, whole grains, and legumes. Go, Caution, and Stop food choices are explained. Plant-based and lean animal proteins are emphasized. Rationale provided for low sodium intake for blood pressure control, low added sugars for blood sugar stabilization, and low added fats and oils for coronary artery disease risk reduction and weight management.  Calorie Density  Clinical staff conducted  group or individual video education with verbal and written material and guidebook.  Patient learns about calorie density and how it impacts the Pritikin Eating Plan. Knowing the characteristics of the food you choose will help you decide whether those foods will lead to weight gain or weight loss, and whether you want to consume more or less of them. Weight loss is usually a side effect of the Pritikin Eating Plan because of its focus on low calorie-dense foods.  Label Reading  Clinical staff conducted group or individual video education with verbal and written material and guidebook.  Patient learns about the Pritikin recommended label reading guidelines and corresponding recommendations regarding calorie density, added sugars, sodium content, and whole grains.  Dining Out - Part 1  Clinical staff conducted group or individual video education with verbal and written material and guidebook.  Patient learns that restaurant meals can be sabotaging because they can be so high in calories, fat, sodium, and/or sugar. Patient learns recommended strategies on how to positively address this and avoid unhealthy pitfalls.  Facts on Fats  Clinical staff conducted group or individual video education with verbal and written material and guidebook.  Patient learns that lifestyle modifications can be just as effective, if not more so, as many medications for lowering your risk of heart disease. A Pritikin lifestyle can help to reduce your risk of inflammation and atherosclerosis (cholesterol build-up, or plaque, in the artery walls). Lifestyle interventions such as dietary choices and physical activity address the cause of atherosclerosis. A review of the types of fats and  their impact on blood cholesterol levels, along with dietary recommendations to reduce fat intake is also included.  Nutrition Action Plan  Clinical staff conducted group or individual video education with verbal and written material and  guidebook.  Patient learns how to incorporate Pritikin recommendations into their lifestyle. Recommendations include planning and keeping personal health goals in mind as an important part of their success.  Healthy Mind-Set    Healthy Minds, Bodies, Hearts  Clinical staff conducted group or individual video education with verbal and written material and guidebook.  Patient learns how to identify when they are stressed. Video will discuss the impact of that stress, as well as the many benefits of stress management. Patient will also be introduced to stress management techniques. The way we think, act, and feel has an impact on our hearts.  How Our Thoughts Can Heal Our Hearts  Clinical staff conducted group or individual video education with verbal and written material and guidebook.  Patient learns that negative thoughts can cause depression and anxiety. This can result in negative lifestyle behavior and serious health problems. Cognitive behavioral therapy is an effective method to help control our thoughts in order to change and improve our emotional outlook.  Additional Videos:  Exercise    Improving Performance  Clinical staff conducted group or individual video education with verbal and written material and guidebook.  Patient learns to use a non-linear approach by alternating intensity levels and lengths of time spent exercising to help burn more calories and lose more body fat. Cardiovascular exercise helps improve heart health, metabolism, hormonal balance, blood sugar control, and recovery from fatigue. Resistance training improves strength, endurance, balance, coordination, reaction time, metabolism, and muscle mass. Flexibility exercise improves circulation, posture, and balance. Seek guidance from your physician and exercise physiologist before implementing an exercise routine and learn your capabilities and proper form for all exercise.  Introduction to Yoga  Clinical staff  conducted group or individual video education with verbal and written material and guidebook.  Patient learns about yoga, a discipline of the coming together of mind, breath, and body. The benefits of yoga include improved flexibility, improved range of motion, better posture and core strength, increased lung function, weight loss, and positive self-image. Yoga's heart health benefits include lowered blood pressure, healthier heart rate, decreased cholesterol and triglyceride levels, improved immune function, and reduced stress. Seek guidance from your physician and exercise physiologist before implementing an exercise routine and learn your capabilities and proper form for all exercise.  Medical   Aging: Enhancing Your Quality of Life  Clinical staff conducted group or individual video education with verbal and written material and guidebook.  Patient learns key strategies and recommendations to stay in good physical health and enhance quality of life, such as prevention strategies, having an advocate, securing a Health Care Proxy and Power of Attorney, and keeping a list of medications and system for tracking them. It also discusses how to avoid risk for bone loss.  Biology of Weight Control  Clinical staff conducted group or individual video education with verbal and written material and guidebook.  Patient learns that weight gain occurs because we consume more calories than we burn (eating more, moving less). Even if your body weight is normal, you may have higher ratios of fat compared to muscle mass. Too much body fat puts you at increased risk for cardiovascular disease, heart attack, stroke, type 2 diabetes, and obesity-related cancers. In addition to exercise, following the Pritikin Eating Plan can help reduce your  risk.  Decoding Lab Results  Clinical staff conducted group or individual video education with verbal and written material and guidebook.  Patient learns that lab test reflects one  measurement whose values change over time and are influenced by many factors, including medication, stress, sleep, exercise, food, hydration, pre-existing medical conditions, and more. It is recommended to use the knowledge from this video to become more involved with your lab results and evaluate your numbers to speak with your doctor.   Diseases of Our Time - Overview  Clinical staff conducted group or individual video education with verbal and written material and guidebook.  Patient learns that according to the CDC, 50% to 70% of chronic diseases (such as obesity, type 2 diabetes, elevated lipids, hypertension, and heart disease) are avoidable through lifestyle improvements including healthier food choices, listening to satiety cues, and increased physical activity.  Sleep Disorders Clinical staff conducted group or individual video education with verbal and written material and guidebook.  Patient learns how good quality and duration of sleep are important to overall health and well-being. Patient also learns about sleep disorders and how they impact health along with recommendations to address them, including discussing with a physician.  Nutrition  Dining Out - Part 2 Clinical staff conducted group or individual video education with verbal and written material and guidebook.  Patient learns how to plan ahead and communicate in order to maximize their dining experience in a healthy and nutritious manner. Included are recommended food choices based on the type of restaurant the patient is visiting.   Fueling a Banker conducted group or individual video education with verbal and written material and guidebook.  There is a strong connection between our food choices and our health. Diseases like obesity and type 2 diabetes are very prevalent and are in large-part due to lifestyle choices. The Pritikin Eating Plan provides plenty of food and hunger-curbing satisfaction. It is  easy to follow, affordable, and helps reduce health risks.  Menu Workshop  Clinical staff conducted group or individual video education with verbal and written material and guidebook.  Patient learns that restaurant meals can sabotage health goals because they are often packed with calories, fat, sodium, and sugar. Recommendations include strategies to plan ahead and to communicate with the manager, chef, or server to help order a healthier meal.  Planning Your Eating Strategy  Clinical staff conducted group or individual video education with verbal and written material and guidebook.  Patient learns about the Pritikin Eating Plan and its benefit of reducing the risk of disease. The Pritikin Eating Plan does not focus on calories. Instead, it emphasizes high-quality, nutrient-rich foods. By knowing the characteristics of the foods, we choose, we can determine their calorie density and make informed decisions.  Targeting Your Nutrition Priorities  Clinical staff conducted group or individual video education with verbal and written material and guidebook.  Patient learns that lifestyle habits have a tremendous impact on disease risk and progression. This video provides eating and physical activity recommendations based on your personal health goals, such as reducing LDL cholesterol, losing weight, preventing or controlling type 2 diabetes, and reducing high blood pressure.  Vitamins and Minerals  Clinical staff conducted group or individual video education with verbal and written material and guidebook.  Patient learns different ways to obtain key vitamins and minerals, including through a recommended healthy diet. It is important to discuss all supplements you take with your doctor.   Healthy Mind-Set    Smoking Cessation  Clinical staff conducted group or individual video education with verbal and written material and guidebook.  Patient learns that cigarette smoking and tobacco addiction pose  a serious health risk which affects millions of people. Stopping smoking will significantly reduce the risk of heart disease, lung disease, and many forms of cancer. Recommended strategies for quitting are covered, including working with your doctor to develop a successful plan.  Culinary   Becoming a Set designer conducted group or individual video education with verbal and written material and guidebook.  Patient learns that cooking at home can be healthy, cost-effective, quick, and puts them in control. Keys to cooking healthy recipes will include looking at your recipe, assessing your equipment needs, planning ahead, making it simple, choosing cost-effective seasonal ingredients, and limiting the use of added fats, salts, and sugars.  Cooking - Breakfast and Snacks  Clinical staff conducted group or individual video education with verbal and written material and guidebook.  Patient learns how important breakfast is to satiety and nutrition through the entire day. Recommendations include key foods to eat during breakfast to help stabilize blood sugar levels and to prevent overeating at meals later in the day. Planning ahead is also a key component.  Cooking - Educational psychologist conducted group or individual video education with verbal and written material and guidebook.  Patient learns eating strategies to improve overall health, including an approach to cook more at home. Recommendations include thinking of animal protein as a side on your plate rather than center stage and focusing instead on lower calorie dense options like vegetables, fruits, whole grains, and plant-based proteins, such as beans. Making sauces in large quantities to freeze for later and leaving the skin on your vegetables are also recommended to maximize your experience.  Cooking - Healthy Salads and Dressing Clinical staff conducted group or individual video education with verbal and written  material and guidebook.  Patient learns that vegetables, fruits, whole grains, and legumes are the foundations of the Pritikin Eating Plan. Recommendations include how to incorporate each of these in flavorful and healthy salads, and how to create homemade salad dressings. Proper handling of ingredients is also covered. Cooking - Soups and [employer] - Soups and Desserts Clinical staff conducted group or individual video education with verbal and written material and guidebook.  Patient learns that Pritikin soups and desserts make for easy, nutritious, and delicious snacks and meal components that are low in sodium, fat, sugar, and calorie density, while high in vitamins, minerals, and filling fiber. Recommendations include simple and healthy ideas for soups and desserts.   Overview     The Pritikin Solution Program Overview Clinical staff conducted group or individual video education with verbal and written material and guidebook.  Patient learns that the results of the Pritikin Program have been documented in more than 100 articles published in peer-reviewed journals, and the benefits include reducing risk factors for (and, in some cases, even reversing) high cholesterol, high blood pressure, type 2 diabetes, obesity, and more! An overview of the three key pillars of the Pritikin Program will be covered: eating well, doing regular exercise, and having a healthy mind-set.  WORKSHOPS  Exercise: Exercise Basics: Building Your Action Plan Clinical staff led group instruction and group discussion with PowerPoint presentation and patient guidebook. To enhance the learning environment the use of posters, models and videos may be added. At the conclusion of this workshop, patients will comprehend the difference between physical activity and  exercise, as well as the benefits of incorporating both, into their routine. Patients will understand the FITT (Frequency, Intensity, Time, and Type) principle  and how to use it to build an exercise action plan. In addition, safety concerns and other considerations for exercise and cardiac rehab will be addressed by the presenter. The purpose of this lesson is to promote a comprehensive and effective weekly exercise routine in order to improve patients' overall level of fitness.   Managing Heart Disease: Your Path to a Healthier Heart Clinical staff led group instruction and group discussion with PowerPoint presentation and patient guidebook. To enhance the learning environment the use of posters, models and videos may be added.At the conclusion of this workshop, patients will understand the anatomy and physiology of the heart. Additionally, they will understand how Pritikin's three pillars impact the risk factors, the progression, and the management of heart disease.  The purpose of this lesson is to provide a high-level overview of the heart, heart disease, and how the Pritikin lifestyle positively impacts risk factors.  Exercise Biomechanics Clinical staff led group instruction and group discussion with PowerPoint presentation and patient guidebook. To enhance the learning environment the use of posters, models and videos may be added. Patients will learn how the structural parts of their bodies function and how these functions impact their daily activities, movement, and exercise. Patients will learn how to promote a neutral spine, learn how to manage pain, and identify ways to improve their physical movement in order to promote healthy living. The purpose of this lesson is to expose patients to common physical limitations that impact physical activity. Participants will learn practical ways to adapt and manage aches and pains, and to minimize their effect on regular exercise. Patients will learn how to maintain good posture while sitting, walking, and lifting.  Balance Training and Fall Prevention  Clinical staff led group instruction and group  discussion with PowerPoint presentation and patient guidebook. To enhance the learning environment the use of posters, models and videos may be added. At the conclusion of this workshop, patients will understand the importance of their sensorimotor skills (vision, proprioception, and the vestibular system) in maintaining their ability to balance as they age. Patients will apply a variety of balancing exercises that are appropriate for their current level of function. Patients will understand the common causes for poor balance, possible solutions to these problems, and ways to modify their physical environment in order to minimize their fall risk. The purpose of this lesson is to teach patients about the importance of maintaining balance as they age and ways to minimize their risk of falling.  WORKSHOPS   Nutrition:  Fueling a Ship broker led group instruction and group discussion with PowerPoint presentation and patient guidebook. To enhance the learning environment the use of posters, models and videos may be added. Patients will review the foundational principles of the Pritikin Eating Plan and understand what constitutes a serving size in each of the food groups. Patients will also learn Pritikin-friendly foods that are better choices when away from home and review make-ahead meal and snack options. Calorie density will be reviewed and applied to three nutrition priorities: weight maintenance, weight loss, and weight gain. The purpose of this lesson is to reinforce (in a group setting) the key concepts around what patients are recommended to eat and how to apply these guidelines when away from home by planning and selecting Pritikin-friendly options. Patients will understand how calorie density may be adjusted for different weight  management goals.  Mindful Eating  Clinical staff led group instruction and group discussion with PowerPoint presentation and patient guidebook. To enhance  the learning environment the use of posters, models and videos may be added. Patients will briefly review the concepts of the Pritikin Eating Plan and the importance of low-calorie dense foods. The concept of mindful eating will be introduced as well as the importance of paying attention to internal hunger signals. Triggers for non-hunger eating and techniques for dealing with triggers will be explored. The purpose of this lesson is to provide patients with the opportunity to review the basic principles of the Pritikin Eating Plan, discuss the value of eating mindfully and how to measure internal cues of hunger and fullness using the Hunger Scale. Patients will also discuss reasons for non-hunger eating and learn strategies to use for controlling emotional eating.  Targeting Your Nutrition Priorities Clinical staff led group instruction and group discussion with PowerPoint presentation and patient guidebook. To enhance the learning environment the use of posters, models and videos may be added. Patients will learn how to determine their genetic susceptibility to disease by reviewing their family history. Patients will gain insight into the importance of diet as part of an overall healthy lifestyle in mitigating the impact of genetics and other environmental insults. The purpose of this lesson is to provide patients with the opportunity to assess their personal nutrition priorities by looking at their family history, their own health history and current risk factors. Patients will also be able to discuss ways of prioritizing and modifying the Pritikin Eating Plan for their highest risk areas  Menu  Clinical staff led group instruction and group discussion with PowerPoint presentation and patient guidebook. To enhance the learning environment the use of posters, models and videos may be added. Using menus brought in from [employer], or printed from [employer], patients will apply the Pritikin dining out  guidelines that were presented in the [employer] video. Patients will also be able to practice these guidelines in a variety of provided scenarios. The purpose of this lesson is to provide patients with the opportunity to practice hands-on learning of the Pritikin Dining Out guidelines with actual menus and practice scenarios.  Label Reading Clinical staff led group instruction and group discussion with PowerPoint presentation and patient guidebook. To enhance the learning environment the use of posters, models and videos may be added. Patients will review and discuss the Pritikin label reading guidelines presented in [employer]'s Label Reading Educational series video. Using fool labels brought in from local grocery stores and markets, patients will apply the label reading guidelines and determine if the packaged food meet the Pritikin guidelines. The purpose of this lesson is to provide patients with the opportunity to review, discuss, and practice hands-on learning of the Pritikin Label Reading guidelines with actual packaged food labels. Cooking School  Pritikin's [employer] are designed to teach patients ways to prepare quick, simple, and affordable recipes at home. The importance of nutrition's role in chronic disease risk reduction is reflected in its emphasis in the overall Pritikin program. By learning how to prepare essential core Pritikin Eating Plan recipes, patients will increase control over what they eat; be able to customize the flavor of foods without the use of added salt, sugar, or fat; and improve the quality of the food they consume. By learning a set of core recipes which are easily assembled, quickly prepared, and affordable, patients are more likely to prepare more healthy foods  at home. These workshops focus on convenient breakfasts, simple entres, side dishes, and desserts which can be prepared with minimal effort and are consistent with nutrition  recommendations for cardiovascular risk reduction. Cooking [employer] are taught by a Armed forces logistics/support/administrative officer (RD) who has been trained by the [employer]. The chef or RD has a clear understanding of the importance of minimizing - if not completely eliminating - added fat, sugar, and sodium in recipes. Throughout the series of Cooking School Workshop sessions, patients will learn about healthy ingredients and efficient methods of cooking to build confidence in their capability to prepare    Cooking School weekly topics:  Adding Flavor- Sodium-Free  Fast and Healthy Breakfasts  Powerhouse Plant-Based Proteins  Satisfying Salads and Dressings  Simple Sides and Sauces  International Cuisine-Spotlight on the [employer] Zones  Delicious Desserts  Savory Soups  [employer] - Meals in a Astronomer Appetizers and Snacks  Comforting Weekend Breakfasts  One-Pot Wonders   Fast Evening Meals  Landscape architect Your Pritikin Plate  WORKSHOPS   Healthy Mindset (Psychosocial):  Focused Goals, Sustainable Changes Clinical staff led group instruction and group discussion with PowerPoint presentation and patient guidebook. To enhance the learning environment the use of posters, models and videos may be added. Patients will be able to apply effective goal setting strategies to establish at least one personal goal, and then take consistent, meaningful action toward that goal. They will learn to identify common barriers to achieving personal goals and develop strategies to overcome them. Patients will also gain an understanding of how our mind-set can impact our ability to achieve goals and the importance of cultivating a positive and growth-oriented mind-set. The purpose of this lesson is to provide patients with a deeper understanding of how to set and achieve personal goals, as well as the tools and strategies needed to overcome common obstacles which may arise along  the way.  From Head to Heart: The Power of a Healthy Outlook  Clinical staff led group instruction and group discussion with PowerPoint presentation and patient guidebook. To enhance the learning environment the use of posters, models and videos may be added. Patients will be able to recognize and describe the impact of emotions and mood on physical health. They will discover the importance of self-care and explore self-care practices which may work for them. Patients will also learn how to utilize the 4 C's to cultivate a healthier outlook and better manage stress and challenges. The purpose of this lesson is to demonstrate to patients how a healthy outlook is an essential part of maintaining good health, especially as they continue their cardiac rehab journey.  Healthy Sleep for a Healthy Heart Clinical staff led group instruction and group discussion with PowerPoint presentation and patient guidebook. To enhance the learning environment the use of posters, models and videos may be added. At the conclusion of this workshop, patients will be able to demonstrate knowledge of the importance of sleep to overall health, well-being, and quality of life. They will understand the symptoms of, and treatments for, common sleep disorders. Patients will also be able to identify daytime and nighttime behaviors which impact sleep, and they will be able to apply these tools to help manage sleep-related challenges. The purpose of this lesson is to provide patients with a general overview of sleep and outline the importance of quality sleep. Patients will learn about a few of the most common sleep disorders. Patients will also be introduced  to the concept of "sleep hygiene," and discover ways to self-manage certain sleeping problems through simple daily behavior changes. Finally, the workshop will motivate patients by clarifying the links between quality sleep and their goals of heart-healthy living.   Recognizing and  Reducing Stress Clinical staff led group instruction and group discussion with PowerPoint presentation and patient guidebook. To enhance the learning environment the use of posters, models and videos may be added. At the conclusion of this workshop, patients will be able to understand the types of stress reactions, differentiate between acute and chronic stress, and recognize the impact that chronic stress has on their health. They will also be able to apply different coping mechanisms, such as reframing negative self-talk. Patients will have the opportunity to practice a variety of stress management techniques, such as deep abdominal breathing, progressive muscle relaxation, and/or guided imagery.  The purpose of this lesson is to educate patients on the role of stress in their lives and to provide healthy techniques for coping with it.  Learning Barriers/Preferences:  Learning Barriers/Preferences - 11/17/22 1014       Learning Barriers/Preferences   Learning Barriers None    Learning Preferences Audio;Skilled Demonstration;Computer/Internet;Verbal Instruction;Group Instruction;Video;Individual Instruction;Written Material;Pictoral             Education Topics:  Knowledge Questionnaire Score:  Knowledge Questionnaire Score - 11/17/22 0902       Knowledge Questionnaire Score   Pre Score 23/24             Core Components/Risk Factors/Patient Goals at Admission:  Personal Goals and Risk Factors at Admission - 11/17/22 1015       Core Components/Risk Factors/Patient Goals on Admission    Weight Management Yes    Intervention Weight Management: Develop a combined nutrition and exercise program designed to reach desired caloric intake, while maintaining appropriate intake of nutrient and fiber, sodium and fats, and appropriate energy expenditure required for the weight goal.;Weight Management: Provide education and appropriate resources to help participant work on and attain dietary  goals.    Expected Outcomes Weight Maintenance: Understanding of the daily nutrition guidelines, which includes 25-35% calories from fat, 7% or less cal from saturated fats, less than 200mg  cholesterol, less than 1.5gm of sodium, & 5 or more servings of fruits and vegetables daily;Understanding recommendations for meals to include 15-35% energy as protein, 25-35% energy from fat, 35-60% energy from carbohydrates, less than 200mg  of dietary cholesterol, 20-35 gm of total fiber daily;Understanding of distribution of calorie intake throughout the day with the consumption of 4-5 meals/snacks    Heart Failure Yes    Intervention Provide a combined exercise and nutrition program that is supplemented with education, support and counseling about heart failure. Directed toward relieving symptoms such as shortness of breath, decreased exercise tolerance, and extremity edema.    Expected Outcomes Improve functional capacity of life;Short term: Attendance in program 2-3 days a week with increased exercise capacity. Reported lower sodium intake. Reported increased fruit and vegetable intake. Reports medication compliance.;Short term: Daily weights obtained and reported for increase. Utilizing diuretic protocols set by physician.;Long term: Adoption of self-care skills and reduction of barriers for early signs and symptoms recognition and intervention leading to self-care maintenance.    Hypertension Yes    Intervention Provide education on lifestyle modifcations including regular physical activity/exercise, weight management, moderate sodium restriction and increased consumption of fresh fruit, vegetables, and low fat dairy, alcohol moderation, and smoking cessation.;Monitor prescription use compliance.    Expected Outcomes Short Term: Continued assessment  and intervention until BP is < 140/64mm HG in hypertensive participants. < 130/42mm HG in hypertensive participants with diabetes, heart failure or chronic kidney  disease.;Long Term: Maintenance of blood pressure at goal levels.    Lipids Yes    Intervention Provide education and support for participant on nutrition & aerobic/resistive exercise along with prescribed medications to achieve LDL 70mg , HDL >40mg .    Expected Outcomes Short Term: Participant states understanding of desired cholesterol values and is compliant with medications prescribed. Participant is following exercise prescription and nutrition guidelines.;Long Term: Cholesterol controlled with medications as prescribed, with individualized exercise RX and with personalized nutrition plan. Value goals: LDL < 70mg , HDL > 40 mg.    Stress Yes    Intervention Offer individual and/or small group education and counseling on adjustment to heart disease, stress management and health-related lifestyle change. Teach and support self-help strategies.;Refer participants experiencing significant psychosocial distress to appropriate mental health specialists for further evaluation and treatment. When possible, include family members and significant others in education/counseling sessions.    Expected Outcomes Short Term: Participant demonstrates changes in health-related behavior, relaxation and other stress management skills, ability to obtain effective social support, and compliance with psychotropic medications if prescribed.;Long Term: Emotional wellbeing is indicated by absence of clinically significant psychosocial distress or social isolation.             Core Components/Risk Factors/Patient Goals Review:   Goals and Risk Factor Review     Row Name 11/27/22 1624 12/15/22 1737 01/13/23 1257         Core Components/Risk Factors/Patient Goals Review   Personal Goals Review Weight Management/Obesity;Stress;Heart Failure;Hypertension;Lipids Weight Management/Obesity;Stress;Heart Failure;Hypertension;Lipids Weight Management/Obesity;Stress;Heart Failure;Hypertension;Lipids     Review Graiden started  intensive cardiac rehab on 11/26/22 and did well with exercise. Vital signs were stable. Lonzie is doing  well with exercise at cardiac rehab. Vital signs have been  stable. Klayton is doing  well with exercise at cardiac rehab. Vital signs have been  stable. Ethanael will complete cardiac rehab on 01/29/23. Rhyan is currently out of town on vacation     Expected Outcomes Izel will continue to participate in intensive cardiac rehab for exercise, nutrition and lifestyle modifications. Yadrian will continue to participate in intensive cardiac rehab for exercise, nutrition and lifestyle modifications. Rangel will continue to participate in intensive cardiac rehab for exercise, nutrition and lifestyle modifications.              Core Components/Risk Factors/Patient Goals at Discharge (Final Review):   Goals and Risk Factor Review - 01/13/23 1257       Core Components/Risk Factors/Patient Goals Review   Personal Goals Review Weight Management/Obesity;Stress;Heart Failure;Hypertension;Lipids    Review Thoma is doing  well with exercise at cardiac rehab. Vital signs have been  stable. Hurshel will complete cardiac rehab on 01/29/23. Osker is currently out of town on vacation    Expected Outcomes Ranger will continue to participate in intensive cardiac rehab for exercise, nutrition and lifestyle modifications.             ITP Comments:  ITP Comments     Row Name 11/17/22 9629 12/15/22 1733 01/13/23 1254       ITP Comments [name], MD: Medical Director.  Introduction to the [facility] / Intensive Cardiac Rehab.  Initial orientation packet reviewed with the patient. 30 Day ITP Review. Margus has good participation and is off to a good start to exercise at intensive cardiac rehab 30 Day ITP Review. Aikeem has good  participation at intensive cardiac rehab when in attendance              Comments: See ITP comments.Thayer Headings RN BSN

## 2023-01-15 ENCOUNTER — Encounter (HOSPITAL_COMMUNITY): Payer: BC Managed Care – PPO

## 2023-01-18 ENCOUNTER — Encounter (HOSPITAL_COMMUNITY): Payer: BC Managed Care – PPO

## 2023-01-20 ENCOUNTER — Encounter (HOSPITAL_COMMUNITY): Payer: BC Managed Care – PPO

## 2023-01-22 ENCOUNTER — Encounter (HOSPITAL_COMMUNITY): Payer: BC Managed Care – PPO

## 2023-01-25 ENCOUNTER — Encounter (HOSPITAL_COMMUNITY)
Admission: RE | Admit: 2023-01-25 | Discharge: 2023-01-25 | Disposition: A | Discharge status: Home / Self Care | Payer: BC Managed Care – PPO | Source: Ambulatory Visit | Attending: Cardiology | Admitting: Cardiology

## 2023-01-25 DIAGNOSIS — Z955 Presence of coronary angioplasty implant and graft: Secondary | ICD-10-CM | POA: Insufficient documentation

## 2023-01-25 DIAGNOSIS — Z48812 Encounter for surgical aftercare following surgery on the circulatory system: Secondary | ICD-10-CM | POA: Insufficient documentation

## 2023-01-25 DIAGNOSIS — I252 Old myocardial infarction: Secondary | ICD-10-CM | POA: Diagnosis not present

## 2023-01-25 DIAGNOSIS — I2111 ST elevation (STEMI) myocardial infarction involving right coronary artery: Secondary | ICD-10-CM

## 2023-01-27 ENCOUNTER — Encounter (HOSPITAL_COMMUNITY)
Admission: RE | Admit: 2023-01-27 | Discharge: 2023-01-27 | Disposition: A | Discharge status: Home / Self Care | Payer: BC Managed Care – PPO | Source: Ambulatory Visit | Attending: Cardiology | Admitting: Cardiology

## 2023-01-27 VITALS — Ht 73.0 in | Wt 209.0 lb

## 2023-01-27 DIAGNOSIS — Z955 Presence of coronary angioplasty implant and graft: Secondary | ICD-10-CM | POA: Diagnosis not present

## 2023-01-27 DIAGNOSIS — I2111 ST elevation (STEMI) myocardial infarction involving right coronary artery: Secondary | ICD-10-CM

## 2023-01-28 ENCOUNTER — Ambulatory Visit (HOSPITAL_COMMUNITY): Payer: BC Managed Care – PPO | Attending: Internal Medicine

## 2023-01-28 DIAGNOSIS — E785 Hyperlipidemia, unspecified: Secondary | ICD-10-CM | POA: Diagnosis present

## 2023-01-28 DIAGNOSIS — I5021 Acute systolic (congestive) heart failure: Secondary | ICD-10-CM | POA: Insufficient documentation

## 2023-01-28 LAB — ECHOCARDIOGRAM COMPLETE
Area-P 1/2: 2.52 cm2
S' Lateral: 3.3 cm

## 2023-01-29 ENCOUNTER — Other Ambulatory Visit: Payer: Self-pay | Admitting: *Deleted

## 2023-01-29 ENCOUNTER — Encounter (HOSPITAL_COMMUNITY)
Admission: RE | Admit: 2023-01-29 | Discharge: 2023-01-29 | Disposition: A | Discharge status: Home / Self Care | Payer: BC Managed Care – PPO | Source: Ambulatory Visit | Attending: Cardiology | Admitting: Cardiology

## 2023-01-29 DIAGNOSIS — Z955 Presence of coronary angioplasty implant and graft: Secondary | ICD-10-CM | POA: Diagnosis not present

## 2023-01-29 DIAGNOSIS — I2111 ST elevation (STEMI) myocardial infarction involving right coronary artery: Secondary | ICD-10-CM

## 2023-01-29 DIAGNOSIS — D729 Disorder of white blood cells, unspecified: Secondary | ICD-10-CM

## 2023-02-01 NOTE — Progress Notes (Signed)
Discharge Progress Report  Patient Details  Name: Craig Macias MRN: [id number] Date of Birth: [birth year] Referring Provider:   Flowsheet Row INTENSIVE CARDIAC REHAB ORIENT from 11/17/2022 in [facility]  Referring Provider Epifanio Lesches, MD        Number of Visits: 12  Reason for Discharge:  Early Exit:  Back to work  Smoking History:  Social History   Tobacco Use  Smoking Status Former   Types: Cigarettes  Smokeless Tobacco Former  Tobacco Comments   social    Diagnosis:  10/27/22 STEMI  10/28/22 Sp DES RPDA  ADL [facility]SD:   Initial Exercise Prescription:  Initial Exercise Prescription - 11/17/22 1000       Date of Initial Exercise RX and Referring Provider   Date 11/17/22    Referring Provider Epifanio Lesches, MD    Expected Discharge Date 01/29/23      Treadmill   MPH 3.2    Grade 0    Minutes 15    METs 3.45      Recumbant Elliptical   Level 2    RPM 60    Watts 25    Minutes 15    METs 4.1      Prescription Details   Frequency (times per week) 3    Duration Progress to 30 minutes of continuous aerobic without signs/symptoms of physical distress      Intensity   THRR 40-80% of Max Heartrate 68-137    Ratings of Perceived Exertion 11-13    Perceived Dyspnea 0-4      Progression   Progression Continue progressive overload as per policy without signs/symptoms or physical distress.      Resistance Training   Training Prescription Yes    Weight 5 lbs    Reps 10-15             Discharge Exercise Prescription (Final Exercise Prescription Changes):  Exercise Prescription Changes - 01/29/23 1607       Response to Exercise   Blood Pressure (Admit) 102/68    Blood Pressure (Exercise) 140/82    Blood Pressure (Exit) 110/60    Heart Rate (Admit) 68 bpm    Heart Rate (Exercise) 112 bpm    Heart Rate (Exit) 77 bpm    Rating of Perceived Exertion (Exercise) 11    Symptoms  None    Comments Pt graduated from the CRP2 program    Duration Continue with 30 min of aerobic exercise without signs/symptoms of physical distress.    Intensity THRR unchanged      Progression   Progression Continue to progress workloads to maintain intensity without signs/symptoms of physical distress.    Average METs 4.8      Resistance Training   Training Prescription Yes    Weight 8 lb wts    Reps 10-15    Time 10 Minutes      Interval Training   Interval Training No      Treadmill   MPH 3.9    Grade 1    Minutes 15    METs 4.52      Recumbant Elliptical   Level 4    RPM 77    Watts 124    Minutes 15    METs 5.1      Home Exercise Plan   Plans to continue exercise at Home (comment)    Frequency Add 4 additional days to program exercise sessions.    Initial Home Exercises  Provided 12/16/22             Functional Capacity:  6 Minute Walk     Row Name 11/17/22 0930 01/25/23 0925       6 Minute Walk   Phase Initial Discharge    Distance 1466 feet 1741 feet    Distance % Change -- 18.76 %    Distance Feet Change -- 275 ft    Walk Time 6 minutes 6 minutes    # of Rest Breaks 0 0    MPH 2.8 3.3    METS 4.11 4.6    RPE 9 11    Perceived Dyspnea  0 0    VO2 Peak 14.4 16.04    Symptoms No No    Resting HR 81 bpm 62 bpm    Resting BP 90/70 110/60    Resting Oxygen Saturation  100 % --    Exercise Oxygen Saturation  during 6 min walk 98 % --    Max Ex. HR 85 bpm 75 bpm    Max Ex. BP 110/70 118/80    2 Minute Post BP 100/60 --             Psychological, QOL, Others - Outcomes: PHQ 2/9:    02/01/2023    1:36 PM 11/17/2022    9:05 AM  Depression screen PHQ 2/9  Decreased Interest 0 1  Down, Depressed, Hopeless 0 1  PHQ - 2 Score 0 2  Altered sleeping 1 1  Tired, decreased energy 1 2  Change in appetite 0 0  Feeling bad or failure about yourself  0 1  Trouble concentrating 0 0  Moving slowly or fidgety/restless 0 0  Suicidal thoughts 0 0   PHQ-9 Score 2 6  Difficult doing work/chores Not difficult at all Not difficult at all    Quality of Life:  Quality of Life - 01/29/23 0832       Quality of Life   Select Quality of Life      Quality of Life Scores   Health/Function Pre 16.73 %    Health/Function Post 23.8 %    Health/Function % Change 42.26 %    Socioeconomic Pre 20.93 %    Socioeconomic Post 25.14 %    Socioeconomic % Change  20.11 %    Psych/Spiritual Pre 11.14 %    Psych/Spiritual Post 21.83 %    Psych/Spiritual % Change 95.96 %    Family Pre 20.4 %    Family Post 30 %    Family % Change 47.06 %    GLOBAL Pre 16.99 %    GLOBAL Post 24.67 %    GLOBAL % Change 45.2 %             Personal Goals: Goals established at orientation with interventions provided to work toward goal.  Personal Goals and Risk Factors at Admission - 11/17/22 1015       Core Components/Risk Factors/Patient Goals on Admission    Weight Management Yes    Intervention Weight Management: Develop a combined nutrition and exercise program designed to reach desired caloric intake, while maintaining appropriate intake of nutrient and fiber, sodium and fats, and appropriate energy expenditure required for the weight goal.;Weight Management: Provide education and appropriate resources to help participant work on and attain dietary goals.    Expected Outcomes Weight Maintenance: Understanding of the daily nutrition guidelines, which includes 25-35% calories from fat, 7% or less cal from saturated fats, less than 200mg  cholesterol, less than  1.5gm of sodium, & 5 or more servings of fruits and vegetables daily;Understanding recommendations for meals to include 15-35% energy as protein, 25-35% energy from fat, 35-60% energy from carbohydrates, less than 200mg  of dietary cholesterol, 20-35 gm of total fiber daily;Understanding of distribution of calorie intake throughout the day with the consumption of 4-5 meals/snacks    Heart Failure Yes     Intervention Provide a combined exercise and nutrition program that is supplemented with education, support and counseling about heart failure. Directed toward relieving symptoms such as shortness of breath, decreased exercise tolerance, and extremity edema.    Expected Outcomes Improve functional capacity of life;Short term: Attendance in program 2-3 days a week with increased exercise capacity. Reported lower sodium intake. Reported increased fruit and vegetable intake. Reports medication compliance.;Short term: Daily weights obtained and reported for increase. Utilizing diuretic protocols set by physician.;Long term: Adoption of self-care skills and reduction of barriers for early signs and symptoms recognition and intervention leading to self-care maintenance.    Hypertension Yes    Intervention Provide education on lifestyle modifcations including regular physical activity/exercise, weight management, moderate sodium restriction and increased consumption of fresh fruit, vegetables, and low fat dairy, alcohol moderation, and smoking cessation.;Monitor prescription use compliance.    Expected Outcomes Short Term: Continued assessment and intervention until BP is < 140/63mm HG in hypertensive participants. < 130/49mm HG in hypertensive participants with diabetes, heart failure or chronic kidney disease.;Long Term: Maintenance of blood pressure at goal levels.    Lipids Yes    Intervention Provide education and support for participant on nutrition & aerobic/resistive exercise along with prescribed medications to achieve LDL 70mg , HDL >40mg .    Expected Outcomes Short Term: Participant states understanding of desired cholesterol values and is compliant with medications prescribed. Participant is following exercise prescription and nutrition guidelines.;Long Term: Cholesterol controlled with medications as prescribed, with individualized exercise RX and with personalized nutrition plan. Value goals: LDL <  70mg , HDL > 40 mg.    Stress Yes    Intervention Offer individual and/or small group education and counseling on adjustment to heart disease, stress management and health-related lifestyle change. Teach and support self-help strategies.;Refer participants experiencing significant psychosocial distress to appropriate mental health specialists for further evaluation and treatment. When possible, include family members and significant others in education/counseling sessions.    Expected Outcomes Short Term: Participant demonstrates changes in health-related behavior, relaxation and other stress management skills, ability to obtain effective social support, and compliance with psychotropic medications if prescribed.;Long Term: Emotional wellbeing is indicated by absence of clinically significant psychosocial distress or social isolation.              Personal Goals Discharge:  Goals and Risk Factor Review     Row Name 11/27/22 1624 12/15/22 1737 01/13/23 1257 02/01/23 1356       Core Components/Risk Factors/Patient Goals Review   Personal Goals Review Weight Management/Obesity;Stress;Heart Failure;Hypertension;Lipids Weight Management/Obesity;Stress;Heart Failure;Hypertension;Lipids Weight Management/Obesity;Stress;Heart Failure;Hypertension;Lipids Weight Management/Obesity;Stress;Heart Failure;Hypertension;Lipids    Review Monolito started intensive cardiac rehab on 11/26/22 and did well with exercise. Vital signs were stable. Evon is doing  well with exercise at cardiac rehab. Vital signs have been  stable. Jarrad is doing  well with exercise at cardiac rehab. Vital signs have been  stable. Anthone will complete cardiac rehab on 01/29/23. Datavion is currently out of town on vacation Kalix graduated today, 01/29/2023 from the Intensive/Traditional CR program completing 12 total education and exercise sessions. Due to work he was unable to complete the  entire program. Core Component Goals: Goal met for  weight loss. Faolan has lost ~4# since starting the program. He has worked with our dietician on healthy eating habits, cutting back on alcohol intake and saturated fats to help lower his liver enzymes. Goal met for blood pressure and heart failure control. Devun' BPs have been WNL while at rehab and he is compliant with his medication for HF and BP. He has lab work and an echo coming up to follow up on heart failure. He had no HF exacerbations while in the program. Goal met for lipid control. [place] was taken off a statin due to elevated liver enzymes. He was started on Repatha and his lipid profile has been WNL since blood work on 01/28/23. Goal met for decreased stress. Prior to starting rehab, Jyquez admits he has been depressed about his recent heart attack. He stated it hit him when he saw his kids crying at the bedside. 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 He states he was blindsided by the recent cardiac events. Post CR Adriano states that his mental health is "better than it was". His Quality-of-Life scores and PHQ 2-9/PHQ 2 scores have both improved. For stress relief Babatunde enjoys golfing, traveling, and the beach. Post-graduation Garney plans to continue his exercise program by working out at a [employer] and golfing 3x week. We are proud of the success Jinu has made in the program!    Expected Outcomes Yousof will continue to participate in intensive cardiac rehab for exercise, nutrition and lifestyle modifications. Macin will continue to participate in intensive cardiac rehab for exercise, nutrition and lifestyle modifications. Draco will continue to participate in intensive cardiac rehab for exercise, nutrition and lifestyle modifications. Deondra will continue to exercise, modify his nutrition and lifestyle post CR graduation.             Exercise Goals and Review:  Exercise Goals     Row Name 11/17/22 1012             Exercise Goals    Increase Physical Activity Yes       Intervention Provide advice, education, support and counseling about physical activity/exercise needs.;Develop an individualized exercise prescription for aerobic and resistive training based on initial evaluation findings, risk stratification, comorbidities and participant's personal goals.       Expected Outcomes Short Term: Attend rehab on a regular basis to increase amount of physical activity.;Long Term: Add in home exercise to make exercise part of routine and to increase amount of physical activity.;Long Term: Exercising regularly at least 3-5 days a week.       Increase Strength and Stamina Yes       Intervention Provide advice, education, support and counseling about physical activity/exercise needs.;Develop an individualized exercise prescription for aerobic and resistive training based on initial evaluation findings, risk stratification, comorbidities and participant's personal goals.       Expected Outcomes Short Term: Increase workloads from initial exercise prescription for resistance, speed, and METs.;Short Term: Perform resistance training exercises routinely during rehab and add in resistance training at home;Long Term: Improve cardiorespiratory fitness, muscular endurance and strength as measured by increased METs and functional capacity ( )       Able to understand and use rate of perceived exertion (RPE) scale Yes       Intervention Provide education and explanation on how to use RPE scale       Expected Outcomes Short Term: Able  to use RPE daily in rehab to express subjective intensity level;Long Term:  Able to use RPE to guide intensity level when exercising independently       Knowledge and understanding of Target Heart Rate Range (THRR) Yes       Intervention Provide education and explanation of THRR including how the numbers were predicted and where they are located for reference       Expected Outcomes Short Term: Able to state/look up  THRR;Short Term: Able to use daily as guideline for intensity in rehab;Long Term: Able to use THRR to govern intensity when exercising independently       Understanding of Exercise Prescription Yes       Intervention Provide education, explanation, and written materials on patient's individual exercise prescription       Expected Outcomes Short Term: Able to explain program exercise prescription;Long Term: Able to explain home exercise prescription to exercise independently                Exercise Goals Re-Evaluation:  Exercise Goals Re-Evaluation     Row Name 11/27/22 1545 01/04/23 1508 01/29/23 1609         Exercise Goal Re-Evaluation   Exercise Goals Review Increase Physical Activity;Increase Strength and Stamina;Able to understand and use rate of perceived exertion (RPE) scale;Knowledge and understanding of Target Heart Rate Range (THRR);Understanding of Exercise Prescription Increase Physical Activity;Increase Strength and Stamina;Able to understand and use rate of perceived exertion (RPE) scale;Knowledge and understanding of Target Heart Rate Range (THRR);Understanding of Exercise Prescription Increase Physical Activity;Increase Strength and Stamina;Able to understand and use rate of perceived exertion (RPE) scale;Knowledge and understanding of Target Heart Rate Range (THRR);Understanding of Exercise Prescription     Comments Pt's first day in the CRP2 program. Pt understands the exercise Rx, RPE scale and THRR. Reviewed METs and goals. Pt voices he is making progress on his goal of increased strength and stamina, voices feeling like "himself" agian. Pt is back to play golf. Has not returned to the gym but will do so soon. Still has some SOB, will talk with MD at tomorrows appt. Pt graduated from the [employer] today. Pt made good progress with peak METs of 5.1. Pt reached his goals of decreased SOB and fatigue. Pt voices his strength and stamina have improved and that he is feeling like  hilmself again and is back to the gym.     Expected Outcomes Will continue to monitor patient and progress exercise workloads as tolerated. Will continue to monitor patient and progress exercise workloads as tolerated. Will continue to exercise at the gym.              Nutrition & Weight - Outcomes:  Pre Biometrics - 11/17/22 0818       Pre Biometrics   Waist Circumference 38 inches    Hip Circumference 42.5 inches    Waist to Hip Ratio 0.89 %    Triceps Skinfold 12 mm    % Body Fat 24.7 %    Grip Strength 38 kg    Flexibility 0 in   Pt could not reach   Single Leg Stand 30 seconds             Post Biometrics - 01/27/23 1112        Post  Biometrics   Height 6\' 1"  (1.854 m)    Weight 94.8 kg    Waist Circumference 38 inches    Hip Circumference 42.5 inches    Waist to Hip  Ratio 0.89 %    BMI (Calculated) 27.58    Triceps Skinfold 12 mm    % Body Fat 24.5 %    Grip Strength 42 kg    Flexibility 0 in    Single Leg Stand 30 seconds             Nutrition:  Nutrition Therapy & Goals - 01/29/23 1025       Nutrition Therapy   Diet Heart Healthy Diet      Personal Nutrition Goals   Nutrition Goal Patient to identify strategies for reducing cardiovascular risk by attending the [facility] education and nutrition series weekly.   goal in progress.   Personal Goal #2 Patient to improve diet quality by using the plate method as a guide for meal planning to include lean protein/plant protein, fruits, vegetables, whole grains, nonfat dairy as part of a well-balanced diet.   goal in action.   Comments [employer] today. Goals in action. Tyeson has continued to attend the [facility] education and nutrition series regularly. Statin was stopped due to elevated LFTs and repatha was started. Per documentation from oncology note on 12/14/17, LFTs have been elevated since at least 2018. Per imaging on 11/2018, consistent with hepatic steatosis fatty liver. We have discussed history of  elevated LFTs and dietary intervention including reduce alcohol intake, decrease in fat intake, increased fiber itnake, etc. He has made many dietary changes including increased plant based protein sources, decreased saturated fat, decreased alcohol intake, and increased dietary fiber. Patient is down 4# since starting with our program.  His LDL has improved to 35 and liver enzymes have improved. Patient will benefit from implementation of the [facility] eating plan/heart healthy eating, exercise, and lifestyle modification.      Intervention Plan   Intervention Prescribe, educate and counsel regarding individualized specific dietary modifications aiming towards targeted core components such as weight, hypertension, lipid management, diabetes, heart failure and other comorbidities.;Nutrition handout(s) given to patient.    Expected Outcomes Short Term Goal: Understand basic principles of dietary content, such as calories, fat, sodium, cholesterol and nutrients.;Long Term Goal: Adherence to prescribed nutrition plan.             Nutrition Discharge:  Nutrition Assessments - 02/04/23 1430       Rate Your Plate Scores   Post Score 81             Education Questionnaire Score:  Knowledge Questionnaire Score - 01/29/23 0831       Knowledge Questionnaire Score   Post Score 23/24             Fayrene Fearing graduated today, 01/29/2023 from the Intensive/Traditional CR program completing 12 total exercise and education sessions. Pt unable to complete full program due to going back to work. Goals reviewed with patient; copy given to patient. Pt maintained good attendance and progressed during his participation in rehab as evidenced by increased METs and workload. Initial pt walked 1466 feet. Post pt walked 1741 feet, a 18.76% increase. Initial PHQ-2/PHQ-9 score 2/6, post scores 0/2. Pt states his mental health is stable. RN reviewed his Quality-of-Life assessment, initial overall score  16.99%, post overall score 24.67%. Scores above 19% indicating a positive quality of life. No areas of concern. Pt declines any needs at the time of graduation. RN also reviewed and updated medication list.   Core component goal met for weight loss. Bora has lost ~4# since starting the program. He has worked with our dietician on healthy eating  habits, cutting back on alcohol intake and saturated fats to help lower his liver enzymes. Goal met for blood pressure and heart failure control. Ayush' BPs have been WNL while at rehab and he is compliant with his medication for HF and BP. He has lab work and an echo coming up to follow up. Goal met for lipid control. Darral was taken off a statin due to elevated liver enzymes. He was started on Repatha and his lipid profile has been WNL since blood work on 01/28/23. Goal met for decreased stress. Prior to starting rehab, Jahmon admits he has been depressed about his recent heart attack. He stated it hit him when he saw his kids crying at the bedside. 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 He states he was blindside by the recent cardiac events. Post CR Ian states that his mental health is "better than it was". His Quality-of-Life scores and PHQ 2-9/PHQ 2 scores have both improved. For stress relief Kendriel enjoys golfing, traveling, and the beach. Post-graduation Lomax plans to continue his exercise program by working out at a [employer] and golfing 3x week. We are proud of the success Sesar has made in the program!

## 2023-02-19 ENCOUNTER — Other Ambulatory Visit: Payer: Self-pay | Admitting: Internal Medicine

## 2023-02-19 DIAGNOSIS — D708 Other neutropenia: Secondary | ICD-10-CM

## 2023-02-20 ENCOUNTER — Inpatient Hospital Stay: Payer: BC Managed Care – PPO | Attending: Internal Medicine | Admitting: Internal Medicine

## 2023-02-20 ENCOUNTER — Inpatient Hospital Stay: Payer: BC Managed Care – PPO

## 2023-02-20 ENCOUNTER — Other Ambulatory Visit: Payer: Self-pay

## 2023-02-20 VITALS — BP 112/81 | HR 65 | Temp 98.4°F | Resp 20 | Wt 206.3 lb

## 2023-02-20 DIAGNOSIS — Z87891 Personal history of nicotine dependence: Secondary | ICD-10-CM | POA: Diagnosis not present

## 2023-02-20 DIAGNOSIS — R0989 Other specified symptoms and signs involving the circulatory and respiratory systems: Secondary | ICD-10-CM | POA: Insufficient documentation

## 2023-02-20 DIAGNOSIS — D72819 Decreased white blood cell count, unspecified: Secondary | ICD-10-CM | POA: Insufficient documentation

## 2023-02-20 DIAGNOSIS — D696 Thrombocytopenia, unspecified: Secondary | ICD-10-CM | POA: Diagnosis not present

## 2023-02-20 DIAGNOSIS — D708 Other neutropenia: Secondary | ICD-10-CM

## 2023-02-20 LAB — CBC WITH DIFFERENTIAL (CANCER CENTER ONLY)
Abs Immature Granulocytes: 0 10*3/uL (ref 0.00–0.07)
Basophils Absolute: 0 10*3/uL (ref 0.0–0.1)
Basophils Relative: 1 %
Eosinophils Absolute: 0 10*3/uL (ref 0.0–0.5)
Eosinophils Relative: 1 %
HCT: 39.7 % (ref 39.0–52.0)
Hemoglobin: 14.3 g/dL (ref 13.0–17.0)
Immature Granulocytes: 0 %
Lymphocytes Relative: 25 %
Lymphs Abs: 0.5 10*3/uL — ABNORMAL LOW (ref 0.7–4.0)
MCH: 34.9 pg — ABNORMAL HIGH (ref 26.0–34.0)
MCHC: 36 g/dL (ref 30.0–36.0)
MCV: 96.8 fL (ref 80.0–100.0)
Monocytes Absolute: 0.3 10*3/uL (ref 0.1–1.0)
Monocytes Relative: 12 %
Neutro Abs: 1.3 10*3/uL — ABNORMAL LOW (ref 1.7–7.7)
Neutrophils Relative %: 61 %
Platelet Count: 88 10*3/uL — ABNORMAL LOW (ref 150–400)
RBC: 4.1 MIL/uL — ABNORMAL LOW (ref 4.22–5.81)
RDW: 11.8 % (ref 11.5–15.5)
WBC Count: 2.1 10*3/uL — ABNORMAL LOW (ref 4.0–10.5)
nRBC: 0 % (ref 0.0–0.2)

## 2023-02-20 LAB — CMP (CANCER CENTER ONLY)
ALT: 43 U/L (ref 0–44)
AST: 31 U/L (ref 15–41)
Albumin: 4.8 g/dL (ref 3.5–5.0)
Alkaline Phosphatase: 60 U/L (ref 38–126)
Anion gap: 6 (ref 5–15)
BUN: 14 mg/dL (ref 6–20)
CO2: 30 mmol/L (ref 22–32)
Calcium: 9.5 mg/dL (ref 8.9–10.3)
Chloride: 106 mmol/L (ref 98–111)
Creatinine: 1.02 mg/dL (ref 0.61–1.24)
GFR, Estimated: >60 mL/min (ref >60)
Glucose, Bld: 90 mg/dL (ref 70–99)
Potassium: 4.1 mmol/L (ref 3.5–5.1)
Sodium: 142 mmol/L (ref 135–145)
Total Bilirubin: 0.6 mg/dL (ref 0.3–1.2)
Total Protein: 7.3 g/dL (ref 6.5–8.1)

## 2023-02-20 LAB — VITAMIN B12: Vitamin B-12: 581 pg/mL (ref 180–914)

## 2023-02-20 LAB — IRON AND IRON BINDING CAPACITY (CC-WL,HP ONLY)
Iron: 93 ug/dL (ref 45–182)
Saturation Ratios: 30 % (ref 17.9–39.5)
TIBC: 308 ug/dL (ref 250–450)
UIBC: 215 ug/dL (ref 117–376)

## 2023-02-20 LAB — FOLATE: Folate: 24 ng/mL (ref >5.9)

## 2023-02-20 LAB — FERRITIN: Ferritin: 298 ng/mL (ref 24–336)

## 2023-02-20 LAB — LACTATE DEHYDROGENASE: LDH: 136 U/L (ref 98–192)

## 2023-02-20 LAB — TSH: TSH: 1.48 u[IU]/mL (ref 0.350–4.500)

## 2023-02-20 NOTE — Progress Notes (Signed)
[facility] [phone]   [phone]  CONSULT NOTE  REFERRING PHYSICIAN: Dr. Epifanio Lesches  REASON FOR CONSULTATION:  52 years old white male with persistent thrombocytopenia.  HPI Craig Macias is a 52 y.o. male with past medical history significant for coronary artery disease status post myocardial infarction status post stent placement and currently on treatment with Repatha and Brilinta by Dr. Bjorn Pippin.  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  He had elevated rheumatoid factor as well as ANA titer at that time.  He was monitored by observation but he was lost to follow-up during the COVID pandemic.  The patient recently had coronary artery disease and he underwent cardiac catheterization with a stent placement in May 2024.  On routine follow-up visit by his cardiologist he was found to have the persistent thrombocytopenia and he was referred to me today for evaluation and recommendation regarding his condition.  He was also found on blood work at that time to have low total white blood count of 2.6 with absolute neutrophil count of 1500 and absolute lymphocyte count of 600.  His platelets count were 101000.   When seen today he has no complaints except for allergy and chest congestion but no significant chest pain, shortness of breath, cough or hemoptysis.  He has no nausea, vomiting, diarrhea or constipation.  He has no headache or visual changes.  He has no recurrent infection.  He intentionally lost a few pounds recently. Family history significant for mother with colitis and father with head and neck cancer. The patient is married and has 3 children from previous marriage.  He works as a Runner, broadcasting/film/video at [employer].  He was accompanied today by his wife Craig Macias.  He has a history of social smoking in the past but quit 2 years  ago.  He also drinks to help socially and uses marijuana Gummies.  HPI  Past Medical History:  Diagnosis Date   Coronary artery disease    Hyperlipidemia    Hypertension     Past Surgical History:  Procedure Laterality Date   CARDIAC CATHETERIZATION     CORONARY/GRAFT ACUTE MI REVASCULARIZATION N/A 10/28/2022   Procedure: Coronary/Graft Acute MI Revascularization;  Surgeon: Marykay Lex, MD;  Location: [facility] INVASIVE CV LAB;  Service: Cardiovascular;  Laterality: N/A;   LEFT HEART CATH AND CORONARY ANGIOGRAPHY N/A 10/28/2022   Procedure: LEFT HEART CATH AND CORONARY ANGIOGRAPHY;  Surgeon: Marykay Lex, MD;  Location: [facility] INVASIVE CV LAB;  Service: Cardiovascular;  Laterality: N/A;   VASECTOMY  09/2015    Family History  Problem Relation Age of Onset   Colitis Mother    Cancer Father        Jaw and lung   Stroke Maternal Grandfather        Cause of death - young   Heart attack Paternal Grandfather     Social History Social History   Tobacco Use   Smoking status: Former    Types: Cigarettes   Smokeless tobacco: Former   Tobacco comments:    Financial trader   Vaping status: Never Used  Substance Use Topics   Alcohol use: Not Currently    Alcohol/week: 1.0 standard drink of alcohol    Types: 1 Cans of beer per week    Comment: occasional   Drug use: Not Currently  Types: Marijuana    Comment: occasional    Allergies  Allergen Reactions   Atorvastatin Other (See Comments)    Elevated LFTs   Amoxil [Amoxicillin] Swelling and Rash   Biaxin [Clarithromycin] Swelling   Penicillins Swelling and Rash    Current Outpatient Medications  Medication Sig Dispense Refill   ASCORBIC ACID PO Take 1 tablet by mouth daily. Vitamin C, unknown strength.     aspirin EC 81 MG tablet Take 1 tablet (81 mg total) by mouth daily. Swallow whole. 90 tablet 1   Evolocumab (REPATHA SURECLICK) 140 MG/ML SOAJ Inject 140 mg into the skin every 14 (fourteen) days. 6 mL 1    fexofenadine (ALLEGRA) 180 MG tablet Take 180 mg by mouth daily.     losartan (COZAAR) 25 MG tablet Take 1 tablet (25 mg total) by mouth daily. 90 tablet 1   MAGNESIUM OXIDE PO Take 1 tablet by mouth at bedtime.     metoprolol succinate (TOPROL-XL) 25 MG 24 hr tablet Take 1 tablet (25 mg total) by mouth daily. 90 tablet 1   Multiple Vitamin (MULTIVITAMIN ADULT PO) 1 tablet Orally once a day     nitroGLYCERIN (NITROSTAT) 0.4 MG SL tablet Place 1 tablet (0.4 mg total) under the tongue every 5 (five) minutes x 3 doses as needed for chest pain. 25 tablet 1   ticagrelor (BRILINTA) 90 MG TABS tablet Take 1 tablet (90 mg total) by mouth 2 (two) times daily. 180 tablet 1   No current facility-administered medications for this visit.    Review of Systems  Constitutional: negative Eyes: negative Ears, nose, mouth, throat, and face: negative Respiratory: negative Cardiovascular: negative Gastrointestinal: negative Genitourinary:negative Integument/breast: negative Hematologic/lymphatic: negative Musculoskeletal:negative Neurological: negative Behavioral/Psych: negative Endocrine: negative Allergic/Immunologic: negative  Physical Exam  ZOX:WRUEA, healthy, no distress, well nourished, and well developed SKIN: skin color, texture, turgor are normal, no rashes or significant lesions HEAD: Normocephalic, No masses, lesions, tenderness or abnormalities EYES: normal, PERRLA, Conjunctiva are pink and non-injected EARS: External ears normal, Canals clear OROPHARYNX:no exudate, no erythema, and lips, buccal mucosa, and tongue normal  NECK: supple, no adenopathy, no JVD LYMPH:  no palpable lymphadenopathy, no hepatosplenomegaly LUNGS: clear to auscultation , and palpation HEART: regular rate & rhythm, no murmurs, and no gallops ABDOMEN:abdomen soft, non-tender, normal bowel sounds, and no masses or organomegaly BACK: Back symmetric, no curvature., No CVA tenderness EXTREMITIES:no joint  deformities, effusion, or inflammation, no edema  NEURO: alert & oriented x 3 with fluent speech, no focal motor/sensory deficits  PERFORMANCE STATUS: ECOG 0  LABORATORY DATA: Lab Results  Component Value Date   WBC 2.6 (L) 01/28/2023   HGB 15.0 01/28/2023   HCT 42.3 01/28/2023   MCV 99 (H) 01/28/2023   PLT 101 (L) 01/28/2023      Chemistry      Component Value Date/Time   NA 141 01/28/2023 0734   NA 142 05/05/2017 0914   K 4.0 01/28/2023 0734   K 4.0 05/05/2017 0914   CL 102 01/28/2023 0734   CO2 25 01/28/2023 0734   CO2 28 05/05/2017 0914   BUN 14 01/28/2023 0734   BUN 13.6 05/05/2017 0914   CREATININE 0.97 01/28/2023 0734   CREATININE 1.0 05/05/2017 0914      Component Value Date/Time   CALCIUM 9.6 01/28/2023 0734   CALCIUM 9.2 05/05/2017 0914   ALKPHOS 66 01/28/2023 0734   ALKPHOS 67 05/05/2017 0914   AST 35 01/28/2023 0734   AST 107 (H) 05/05/2017 0914  ALT 52 (H) 01/28/2023 0734   ALT 241 (H) 05/05/2017 0914   BILITOT 0.9 01/28/2023 0734   BILITOT 0.93 05/05/2017 0914       RADIOGRAPHIC STUDIES: LONG TERM MONITOR (3-14 DAYS)  Result Date: 01/29/2023   No significant heart rhythm abnormalities Patch Wear Time:  6 days and 13 hours (2024-07-26T18:33:34-0400 to 2024-08-02T08:04:42-0400) Patient had a min HR of 47 bpm, max HR of 158 bpm, and avg HR of 70 bpm. Predominant underlying rhythm was Sinus Rhythm. Isolated SVEs were rare (<1.0%), SVE Triplets were rare (<1.0%), and no SVE Couplets were present. Isolated VEs were rare (<1.0%), and no VE Couplets or VE Triplets were present. Ventricular Bigeminy was present.  Patient triggered events corresponded to sinus rhythm  PVCs   ECHOCARDIOGRAM COMPLETE  Result Date: 01/28/2023    ECHOCARDIOGRAM REPORT   Patient Name:   KELCY STERCHI Date of Exam: 01/28/2023 Medical Rec #:  [id number]     Height:       73.0 in Accession #:    [id number]    Weight:       209.0 lb Date of Birth:  03-11-1971    BSA:          2.192 m  Patient Age:    51 years      BP:           110/76 mmHg Patient Gender: M             HR:           53 bpm. Exam Location:  [address] Procedure: 2D Echo, 3D Echo, Cardiac Doppler, Color Doppler and Strain Analysis Indications:    I50.21 CHF  History:        Patient has prior history of Echocardiogram examinations, most                 recent 10/29/2022. CAD and STEMI, Signs/Symptoms:Chest Pain; Risk                 Factors:Hypertension and HLD.  Sonographer:    Clearence Ped RCS Referring Phys: [id number] CHRISTOPHER L SCHUMANN IMPRESSIONS  1. Apical window is foreshortened some in some views Minimal hypokinesis of apex . Left ventricular ejection fraction, by estimation, is 55 to 60%. The left ventricle has normal function. Left ventricular diastolic parameters are indeterminate. The average left ventricular global longitudinal strain is -19.1 %. The global longitudinal strain is normal.  2. Right ventricular systolic function is normal. The right ventricular size is normal. There is normal pulmonary artery systolic pressure.  3. The mitral valve is normal in structure. Mild mitral valve regurgitation.  4. The aortic valve is normal in structure. Aortic valve regurgitation is not visualized.  5. The inferior vena cava is normal in size with greater than 50% respiratory variability, suggesting right atrial pressure of 3 mmHg. FINDINGS  Left Ventricle: Apical window is foreshortened some in some views Minimal hypokinesis of apex. Left ventricular ejection fraction, by estimation, is 55 to 60%. The left ventricle has normal function. The average left ventricular global longitudinal strain is -19.1 %. The global longitudinal strain is normal. The left ventricular internal cavity size was normal in size. There is no left ventricular hypertrophy. Left ventricular diastolic parameters are indeterminate. Right Ventricle: The right ventricular size is normal. Right vetricular wall thickness was not assessed. Right  ventricular systolic function is normal. There is normal pulmonary artery systolic pressure. The tricuspid regurgitant velocity is 1.31 m/s, and with an assumed right atrial  pressure of 3 mmHg, the estimated right ventricular systolic pressure is 9.9 mmHg. Left Atrium: Left atrial size was normal in size. Right Atrium: Right atrial size was normal in size. Pericardium: There is no evidence of pericardial effusion. Mitral Valve: The mitral valve is normal in structure. Mild mitral valve regurgitation. Tricuspid Valve: The tricuspid valve is normal in structure. Tricuspid valve regurgitation is mild. Aortic Valve: The aortic valve is normal in structure. Aortic valve regurgitation is not visualized. Pulmonic Valve: The pulmonic valve was grossly normal. Pulmonic valve regurgitation is not visualized. Aorta: The aortic root and ascending aorta are structurally normal, with no evidence of dilitation. Venous: The inferior vena cava is normal in size with greater than 50% respiratory variability, suggesting right atrial pressure of 3 mmHg. IAS/Shunts: No atrial level shunt detected by color flow Doppler.  LEFT VENTRICLE PLAX 2D LVIDd:         4.50 cm   Diastology LVIDs:         3.30 cm   LV e' medial:    7.94 cm/s LV PW:         1.00 cm   LV E/e' medial:  6.9 LV IVS:        1.00 cm   LV e' lateral:   7.07 cm/s LVOT diam:     2.00 cm   LV E/e' lateral: 7.8 LV SV:         66 LV SV Index:   30        2D Longitudinal Strain LVOT Area:     3.14 cm  2D Strain GLS Avg:     -19.1 %                           3D Volume EF:                          3D EF:        59 %                          LV EDV:       156 ml                          LV ESV:       64 ml                          LV SV:        91 ml RIGHT VENTRICLE RV Basal diam:  3.80 cm RV S prime:     11.60 cm/s TAPSE (M-mode): 2.2 cm RVSP:           9.9 mmHg LEFT ATRIUM             Index        RIGHT ATRIUM           Index LA diam:        3.20 cm 1.46 cm/m   RA Pressure: 3.00  mmHg LA Vol (A2C):   46.5 ml 21.21 ml/m  RA Area:     13.80 cm LA Vol (A4C):   35.8 ml 16.33 ml/m  RA Volume:   33.00 ml  15.05 ml/m LA Biplane Vol: 43.5 ml 19.84 ml/m  AORTIC VALVE LVOT Vmax:   102.00 cm/s LVOT Vmean:  65.200 cm/s LVOT  VTI:    0.210 m  AORTA Ao Root diam: 4.00 cm Ao Asc diam:  3.20 cm MITRAL VALVE               TRICUSPID VALVE MV Area (PHT):             TR Peak grad:   6.9 mmHg MV Decel Time:             TR Vmax:        131.00 cm/s MV E velocity: 55.00 cm/s  Estimated RAP:  3.00 mmHg MV A velocity: 67.10 cm/s  RVSP:           9.9 mmHg MV E/A ratio:  0.82                            SHUNTS                            Systemic VTI:  0.21 m                            Systemic Diam: 2.00 cm Dietrich Pates MD Electronically signed by Dietrich Pates MD Signature Date/Time: 01/28/2023/11:22:49 AM    Final     ASSESSMENT: This is a very pleasant 52 years old white male with persistent leukocytopenia and thrombocytopenia.  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  His leukocytopenia could be also autoimmune in origin but drug-induced or underlying bone marrow abnormality could not be completely excluded at this point.   PLAN: I had a lengthy discussion with the patient and his wife today about his current condition and further investigation to identify the etiology of his abnormalities. I explained to the patient and his wife that his blood abnormalities are likely drug-induced versus underlying immune mediated abnormalities but again bone marrow disorder could not be completely excluded with bicytopenia.  His rheumatoid factor that was performed on January 15, 2017 was elevated to 170.9 and he also had a positive ANA at that time. I ordered several studies today including repeat CBC that showed further decrease in his total white blood count down to 2.1 with absolute neutrophil count of 1300.  His platelets count are 88,000.  The patient has normal hemoglobin and  hematocrit.  Comprehensive metabolic panel and iron study are unremarkable.  Other labs including ferritin, vitamin B12, serum folate, TSH and LDH are still pending My initial plan was to see him back for follow-up visit in 3 months with close monitoring but with the recent CBC finding I will reach out to the patient and consider him for a bone marrow biopsy and aspirate and follow-up visit 1-2 weeks after the biopsy. The patient was advised to call immediately if he has any other concerning symptoms in the interval. The patient voices understanding of current disease status and treatment options and is in agreement with the current care plan.  All questions were answered. The patient knows to call the clinic with any problems, questions or concerns. We can certainly see the patient much sooner if necessary.  Thank you so much for allowing me to participate in the care of Craig Macias. I will continue to follow up the patient with you and assist in his care.  The total time spent in the appointment was 60 minutes.  Disclaimer: This note was dictated  with voice recognition software. Similar sounding words can inadvertently be transcribed and may not be corrected upon review.   Lajuana Matte February 20, 2023, 9:18 AM

## 2023-02-21 ENCOUNTER — Encounter: Payer: Self-pay | Admitting: Internal Medicine

## 2023-02-22 ENCOUNTER — Telehealth: Payer: Self-pay | Admitting: Medical Oncology

## 2023-02-22 ENCOUNTER — Encounter: Payer: Self-pay | Admitting: Cardiology

## 2023-02-22 NOTE — Telephone Encounter (Signed)
LVM reiterating the communication notes given to him from CT staff about taking his

## 2023-02-23 ENCOUNTER — Encounter: Payer: Self-pay | Admitting: Medical Oncology

## 2023-03-10 ENCOUNTER — Other Ambulatory Visit: Payer: Self-pay | Admitting: Radiology

## 2023-03-10 DIAGNOSIS — D696 Thrombocytopenia, unspecified: Secondary | ICD-10-CM

## 2023-03-11 NOTE — Consult Note (Signed)
Chief Complaint: Patient was seen in consultation today for CT-guided bone marrow biopsy  Referring Physician(s): Mohamed,Mohamed  Supervising Physician: Mir, Secondary school teacher  Patient Status: [facility] - Out-pt  History of Present Illness: Craig Macias is a 52 y.o. male ex smoker with past medical history of coronary artery disease/MI/stenting, hyperlipidemia, positive rheumatoid factor and ANA, hypertension and persistent leukocytopenia and thrombocytopenia.  He is scheduled today for CT-guided bone marrow biopsy for further evaluation.  Past Medical History:  Diagnosis Date   Coronary artery disease    Hyperlipidemia    Hypertension     Past Surgical History:  Procedure Laterality Date   CARDIAC CATHETERIZATION     CORONARY/GRAFT ACUTE MI REVASCULARIZATION N/A 10/28/2022   Procedure: Coronary/Graft Acute MI Revascularization;  Surgeon: Marykay Lex, MD;  Location: [facility] INVASIVE CV LAB;  Service: Cardiovascular;  Laterality: N/A;   LEFT HEART CATH AND CORONARY ANGIOGRAPHY N/A 10/28/2022   Procedure: LEFT HEART CATH AND CORONARY ANGIOGRAPHY;  Surgeon: Marykay Lex, MD;  Location: [facility] INVASIVE CV LAB;  Service: Cardiovascular;  Laterality: N/A;   VASECTOMY  09/2015    Allergies: Atorvastatin, Amoxil [amoxicillin], Biaxin [clarithromycin], and Penicillins  Medications: Prior to Admission medications   Medication Sig Start Date End Date Taking? Authorizing Provider  ASCORBIC ACID PO Take 1 tablet by mouth daily. Vitamin C, unknown strength.    [provider]  aspirin EC 81 MG tablet Take 1 tablet (81 mg total) by mouth daily. Swallow whole. 11/11/22   Little Ishikawa, MD  Evolocumab (REPATHA SURECLICK) 140 MG/ML SOAJ Inject 140 mg into the skin every 14 (fourteen) days. 11/11/22   Little Ishikawa, MD  fexofenadine (ALLEGRA) 180 MG tablet Take 180 mg by mouth daily.    [provider]  losartan (COZAAR) 25 MG tablet Take 1 tablet (25 mg total) by  mouth daily. 11/11/22   Little Ishikawa, MD  MAGNESIUM OXIDE PO Take 1 tablet by mouth at bedtime.    [provider]  metoprolol succinate (TOPROL-XL) 25 MG 24 hr tablet Take 1 tablet (25 mg total) by mouth daily. 11/11/22   Little Ishikawa, MD  Multiple Vitamin (MULTIVITAMIN ADULT PO) 1 tablet Orally once a day    [provider]  nitroGLYCERIN (NITROSTAT) 0.4 MG SL tablet Place 1 tablet (0.4 mg total) under the tongue every 5 (five) minutes x 3 doses as needed for chest pain. 10/30/22   Jonita Albee, PA-C  ticagrelor (BRILINTA) 90 MG TABS tablet Take 1 tablet (90 mg total) by mouth 2 (two) times daily. 11/11/22   Little Ishikawa, MD     Family History  Problem Relation Age of Onset   Colitis Mother    Cancer Father        Jaw and lung   Stroke Maternal Grandfather        Cause of death - young   Heart attack Paternal Grandfather     Social History   Socioeconomic History   Marital status: Married    Spouse name: Not on file   Number of children: Not on file   Years of education: Not on file   Highest education level: Bachelor's degree (e.g., BA, AB, BS)  Occupational History   Not on file  Tobacco Use   Smoking status: Former    Types: Cigarettes   Smokeless tobacco: Former   Tobacco comments:    social  Advertising account planner   Vaping status: Never Used  Substance and Sexual Activity  Alcohol use: Not Currently    Alcohol/week: 1.0 standard drink of alcohol    Types: 1 Cans of beer per week    Comment: occasional   Drug use: Not Currently    Types: Marijuana    Comment: occasional   Sexual activity: Yes    Birth control/protection: None  Other Topics Concern   Not on file  Social History Narrative   Not on file   Social Determinants of Health   Financial Resource Strain: Low Risk  (10/28/2022)   Overall Financial Resource Strain (CARDIA)    Difficulty of Paying Living Expenses: Not hard at all  Food Insecurity: No Food  Insecurity (10/28/2022)   Hunger Vital Sign    Worried About Running Out of Food in the Last Year: Never true    Ran Out of Food in the Last Year: Never true  Transportation Needs: No Transportation Needs (10/28/2022)   PRAPARE - Administrator, Civil Service (Medical): No    Lack of Transportation (Non-Medical): No  Physical Activity: Not on file  Stress: Not on file  Social Connections: Not on file     Review of Systems denies fever,HA,CP,dyspnea, cough, abd/back pain,N/V or bleeding  Vital Signs: Vitals:   03/12/23 0715  BP: 114/79  Pulse: 68  Resp: 18  Temp: 98.4 F (36.9 C)  SpO2: 99%      Code Status: FULL CODE    Physical Exam: awake/alert; chest- CTA bilat; heart- RRR; abd- soft,+BS,NT; no LE edema  Imaging: No results found.  Labs:  CBC: Recent Labs    10/30/22 0041 11/06/22 1141 01/28/23 0734 02/20/23 0855  WBC 6.6 3.8 2.6* 2.1*  HGB 14.1 14.2 15.0 14.3  HCT 39.0 40.2 42.3 39.7  PLT 104* 225 101* 88*    COAGS: No results for input(s): "INR", "APTT" in the last 8760 hours.  BMP: Recent Labs    10/28/22 0940 10/29/22 0107 10/30/22 0041 11/06/22 1141 01/28/23 0734 02/20/23 0855  NA 135 134* 134* 139 141 142  K 4.0 3.7 3.9 4.6 4.0 4.1  CL 103 101 98 100 102 106  CO2 22 24 27 25 25 30   GLUCOSE 103* 107* 102* 92 92 90  BUN 12 12 14 17 14 14   CALCIUM 8.7* 9.1 8.9 9.5 9.6 9.5  CREATININE 0.85 0.89 1.03 0.95 0.97 1.02  GFRNONAA >60 >60 >60  --   --  >60    LIVER FUNCTION TESTS: Recent Labs    10/30/22 0041 11/06/22 1141 01/28/23 0734 02/20/23 0855  BILITOT 1.3* 0.7 0.9 0.6  AST 43* 65* 35 31  ALT 63* 129* 52* 43  ALKPHOS 76 166* 66 60  PROT 6.8 7.6 7.2 7.3  ALBUMIN 3.4* 4.5 5.0 4.8    TUMOR MARKERS: No results for input(s): "AFPTM", "CEA", "CA199", "CHROMGRNA" in the last 8760 hours.  Assessment and Plan: 52 y.o. male with past medical history of coronary artery disease/MI/stenting, hyperlipidemia, positive  rheumatoid factor and ANA, hypertension and persistent leukocytopenia and thrombocytopenia.  He is scheduled today for CT-guided bone marrow biopsy for further evaluation.Risks and benefits of procedure was discussed with the patient/spouse  including, but not limited to bleeding, infection, damage to adjacent structures or low yield requiring additional tests.  All of the questions were answered and there is agreement to proceed.  Consent signed and in chart.    Thank you for this interesting consult.  I greatly enjoyed meeting DANILE TRIER and look forward to participating in their care.  A copy of this report was sent to the requesting provider on this date.  Electronically Signed: D. Jeananne Rama, PA-C 03/11/2023, 9:40 AM   I spent a total of 20 minutes  in face to face in clinical consultation, greater than 50% of which was counseling/coordinating care for CT-guided bone marrow biopsy

## 2023-03-12 ENCOUNTER — Ambulatory Visit (HOSPITAL_COMMUNITY)
Admission: RE | Admit: 2023-03-12 | Discharge: 2023-03-12 | Disposition: A | Discharge status: Home / Self Care | Payer: BC Managed Care – PPO | Source: Ambulatory Visit | Attending: Internal Medicine | Admitting: Internal Medicine

## 2023-03-12 ENCOUNTER — Encounter (HOSPITAL_COMMUNITY): Payer: Self-pay

## 2023-03-12 ENCOUNTER — Other Ambulatory Visit: Payer: Self-pay

## 2023-03-12 DIAGNOSIS — I2510 Atherosclerotic heart disease of native coronary artery without angina pectoris: Secondary | ICD-10-CM | POA: Diagnosis not present

## 2023-03-12 DIAGNOSIS — Z955 Presence of coronary angioplasty implant and graft: Secondary | ICD-10-CM | POA: Diagnosis present

## 2023-03-12 DIAGNOSIS — I252 Old myocardial infarction: Secondary | ICD-10-CM | POA: Diagnosis not present

## 2023-03-12 DIAGNOSIS — D696 Thrombocytopenia, unspecified: Secondary | ICD-10-CM | POA: Diagnosis not present

## 2023-03-12 DIAGNOSIS — I10 Essential (primary) hypertension: Secondary | ICD-10-CM | POA: Insufficient documentation

## 2023-03-12 DIAGNOSIS — E785 Hyperlipidemia, unspecified: Secondary | ICD-10-CM | POA: Diagnosis not present

## 2023-03-12 LAB — CBC WITH DIFFERENTIAL/PLATELET
Abs Immature Granulocytes: 0 10*3/uL (ref 0.00–0.07)
Basophils Absolute: 0 10*3/uL (ref 0.0–0.1)
Basophils Relative: 0 %
Eosinophils Absolute: 0 10*3/uL (ref 0.0–0.5)
Eosinophils Relative: 1 %
HCT: 40.6 % (ref 39.0–52.0)
Hemoglobin: 14.1 g/dL (ref 13.0–17.0)
Immature Granulocytes: 0 %
Lymphocytes Relative: 26 %
Lymphs Abs: 0.7 10*3/uL (ref 0.7–4.0)
MCH: 34.1 pg — ABNORMAL HIGH (ref 26.0–34.0)
MCHC: 34.7 g/dL (ref 30.0–36.0)
MCV: 98.1 fL (ref 80.0–100.0)
Monocytes Absolute: 0.4 10*3/uL (ref 0.1–1.0)
Monocytes Relative: 15 %
Neutro Abs: 1.5 10*3/uL — ABNORMAL LOW (ref 1.7–7.7)
Neutrophils Relative %: 58 %
Platelets: 81 10*3/uL — ABNORMAL LOW (ref 150–400)
RBC: 4.14 MIL/uL — ABNORMAL LOW (ref 4.22–5.81)
RDW: 12 % (ref 11.5–15.5)
WBC: 2.6 10*3/uL — ABNORMAL LOW (ref 4.0–10.5)
nRBC: 0 % (ref 0.0–0.2)

## 2023-03-12 MED ORDER — FENTANYL CITRATE (PF) 100 MCG/2ML IJ SOLN
INTRAMUSCULAR | Status: AC
  Filled 2023-03-12: qty 4

## 2023-03-12 MED ORDER — FENTANYL CITRATE (PF) 100 MCG/2ML IJ SOLN
INTRAMUSCULAR | Status: AC | PRN
  Administered 2023-03-12: 50 ug via INTRAVENOUS

## 2023-03-12 MED ORDER — MIDAZOLAM HCL 2 MG/2ML IJ SOLN
INTRAMUSCULAR | Status: AC | PRN
  Administered 2023-03-12: 1 mg via INTRAVENOUS

## 2023-03-12 MED ORDER — MIDAZOLAM HCL 2 MG/2ML IJ SOLN
INTRAMUSCULAR | Status: AC | PRN
Start: 2023-03-12 — End: 2023-03-12
  Administered 2023-03-12: 1 mg via INTRAVENOUS

## 2023-03-12 MED ORDER — MIDAZOLAM HCL 2 MG/2ML IJ SOLN
INTRAMUSCULAR | Status: AC
  Filled 2023-03-12: qty 4

## 2023-03-12 MED ORDER — SODIUM CHLORIDE 0.9 % IV SOLN: INTRAVENOUS | Status: DC

## 2023-03-12 MED ORDER — FENTANYL CITRATE (PF) 100 MCG/2ML IJ SOLN
INTRAMUSCULAR | Status: AC | PRN
Start: 2023-03-12 — End: 2023-03-12
  Administered 2023-03-12: 50 ug via INTRAVENOUS

## 2023-03-12 MED ORDER — NALOXONE HCL 0.4 MG/ML IJ SOLN
INTRAMUSCULAR | Status: AC
  Filled 2023-03-12: qty 1

## 2023-03-12 MED ORDER — FLUMAZENIL 0.5 MG/5ML IV SOLN
INTRAVENOUS | Status: AC
  Filled 2023-03-12: qty 5

## 2023-03-12 NOTE — Discharge Instructions (Signed)
For questions /concerns may call Interventional Radiology at [phone] or  Interventional Radiology clinic [phone]   You may remove your dressing and shower tomorrow afternoon                                                   Bone Marrow Aspiration and Bone Marrow Biopsy, Adult, Care After The following information offers guidance on how to care for yourself after your procedure. Your health care provider may also give you more specific instructions. If you have problems or questions, contact your health care provider. What can I expect after the procedure? After the procedure, it is common to have: Mild pain and tenderness. Swelling. Bruising. Follow these instructions at home: Incision care  Follow instructions from your health care provider about how to take care of the incision site. Make sure you: Wash your hands with soap and water for at least 20 seconds before and after you change your bandage (dressing). If soap and water are not available, use hand sanitizer. Change your dressing as told by your health care provider. Leave stitches (sutures), skin glue, or adhesive strips in place. These skin closures may need to stay in place for 2 weeks or longer. If adhesive strip edges start to loosen and curl up, you may trim the loose edges. Do not remove adhesive strips completely unless your health care provider tells you to do that. Check your incision site every day for signs of infection. Check for: More redness, swelling, or pain. Fluid or blood. Warmth. Pus or a bad smell. Activity Return to your normal activities as told by your health care provider. Ask your health care provider what activities are safe for you. Do not lift anything that is heavier than 10 lb (4.5 kg), or the limit that you are told, until your health care provider says that it is safe. If you were given a sedative during the procedure, it can affect you for several hours. Do not drive or operate machinery  until your health care provider says that it is safe. General instructions  Take over-the-counter and prescription medicines only as told by your health care provider. Do not take baths, swim, or use a hot tub until your health care provider approves. Ask your health care provider if you may take showers. You may only be allowed to take sponge baths. If directed, put ice on the affected area. To do this: Put ice in a plastic bag. Place a towel between your skin and the bag. Leave the ice on for 20 minutes, 2-3 times a day. If your skin turns bright red, remove the ice right away to prevent skin damage. The risk of skin damage is higher if you cannot feel pain, heat, or cold. Contact a health care provider if: You have signs of infection. Your pain is not controlled with medicine. You have cancer, and a temperature of 100.52F (38C) or higher. Get help right away if: You have a temperature of 101F (38.3C) or higher, or as told by your health care provider. You have bleeding from the incision site that cannot be controlled. This information is not intended to replace advice given to you by your health care provider. Make sure you discuss any questions you have with your health care provider. Document Revised: 10/06/2021 Document Reviewed: 10/06/2021 Elsevier Patient Education  2024 [employer].  Moderate Conscious Sedation, Adult, Care After After the procedure, it is common to have: Sleepiness for a few hours. Impaired judgment for a few hours. Trouble with balance. Nausea or vomiting if you eat too soon. Follow these instructions at home: For the time period you were told by your health care provider:  Rest. Do not participate in activities where you could fall or become injured. Do not drive or use machinery. Do not drink alcohol. Do not take sleeping pills or medicines that cause drowsiness. Do not  make important decisions or sign legal documents. Do not take care of children on your own. Eating and drinking Follow instructions from your health care provider about what you may eat and drink. Drink enough fluid to keep your urine pale yellow. If you vomit: Drink clear fluids slowly and in small amounts as you are able. Clear fluids include water, ice chips, low-calorie sports drinks, and fruit juice that has water added to it (diluted fruit juice). Eat light and bland foods in small amounts as you are able. These foods include bananas, applesauce, rice, lean meats, toast, and crackers. General instructions Take over-the-counter and prescription medicines only as told by your health care provider. Have a responsible adult stay with you for the time you are told. Do not use any products that contain nicotine or tobacco. These products include cigarettes, chewing tobacco, and vaping devices, such as e-cigarettes. If you need help quitting, ask your health care provider. Return to your normal activities as told by your health care provider. Ask your health care provider what activities are safe for you. Your health care provider may give you more instructions. Make sure you know what you can and cannot do. Contact a health care provider if: You are still sleepy or having trouble with balance after 24 hours. You feel light-headed. You vomit every time you eat or drink. You get a rash. You have a fever. You have redness or swelling around the IV site. Get help right away if: You have trouble breathing. You start to feel confused at home. These symptoms may be an emergency. Get help right away. Call 911. Do not wait to see if the symptoms will go away. Do not drive yourself to the hospital. This information is not intended to replace advice given to you by your health care provider. Make sure you discuss any questions you have with your health care provider. Document Revised: 12/15/2021  Document Reviewed: 12/15/2021 Elsevier Patient Education  2024 [employer].

## 2023-03-12 NOTE — Procedures (Signed)
Interventional Radiology Procedure Note  Indication: Bicytopenia  Procedure: CT guided aspirate and core biopsy of right iliac bone  Complications: None  Bleeding: Minimal  Acquanetta Belling, MD [phone]

## 2023-03-18 ENCOUNTER — Encounter: Payer: Self-pay | Admitting: Internal Medicine

## 2023-03-19 ENCOUNTER — Encounter (HOSPITAL_COMMUNITY): Payer: Self-pay

## 2023-03-19 NOTE — Telephone Encounter (Signed)
Notified that pathology was seen by Cassie Heilingoetter, PA. No signs of cancer,but we will make an appt with Dr Arbutus Ped next week to review all results in depth.

## 2023-03-22 LAB — SURGICAL PATHOLOGY

## 2023-03-23 ENCOUNTER — Telehealth: Payer: Self-pay | Admitting: Internal Medicine

## 2023-03-23 NOTE — Progress Notes (Deleted)
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 male returns to the clinic today for a follow up visit. The patient was last seen in the clinic on 02/20/23 for consultation by Dr. Arbutus Ped or his thrombocytopenia. Of note, the patient had positive ANA and RF. He is scheduled to see ***are you seeing rheumatology ***. At the time of his appointment, he also had leukopenia in addition to thrombocytopenia. Therefore, Dr. Arbutus Ped recommended bone marrow biopsy and aspirate to assess for bone marrow pathology. He had this performed on 03/12/23 and tolerated this ***.   As last being seen he denies any major changes in his health.  Denies any fever, chills, night sweats, or unexplained weight loss.  Denies any abnormal bleeding or bruising.  Denies any recent nasal congestion, sore throat, cough, skin infections, dysuria, diarrhea, or abdominal pain.  Infections?  Viral infections?  He is here today to review the results of his bone marrow biopsy and aspirate.           MEDICAL HISTORY: Past Medical History:  Diagnosis Date   Coronary artery disease    Hyperlipidemia    Hypertension     ALLERGIES:  is allergic to atorvastatin, amoxil [amoxicillin], biaxin [clarithromycin], and penicillins.  MEDICATIONS:  Current Outpatient Medications  Medication Sig Dispense Refill   ASCORBIC ACID PO Take 1 tablet by mouth daily. Vitamin C, unknown strength.     aspirin EC 81 MG tablet Take 1 tablet (81 mg total) by mouth daily. Swallow whole. 90 tablet 1   Evolocumab (REPATHA SURECLICK) 140 MG/ML SOAJ Inject 140 mg into the skin every 14 (fourteen) days. 6 mL 1    fexofenadine (ALLEGRA) 180 MG tablet Take 180 mg by mouth daily.     losartan (COZAAR) 25 MG tablet Take 1 tablet (25 mg total) by mouth daily. 90 tablet 1   MAGNESIUM OXIDE PO Take 1 tablet by mouth at bedtime.     metoprolol succinate (TOPROL-XL) 25 MG 24 hr tablet Take 1 tablet (25 mg total) by mouth daily. 90 tablet 1   Multiple Vitamin (MULTIVITAMIN ADULT PO) 1 tablet Orally once a day     nitroGLYCERIN (NITROSTAT) 0.4 MG SL tablet Place 1 tablet (0.4 mg total) under the tongue every 5 (five) minutes x 3 doses as needed for chest pain. 25 tablet 1   ticagrelor (BRILINTA) 90 MG TABS tablet Take 1 tablet (90 mg total) by mouth 2 (two) times daily. 180 tablet 1   No current facility-administered medications for this visit.    SURGICAL HISTORY:  Past Surgical History:  Procedure Laterality Date   CARDIAC CATHETERIZATION     CORONARY/GRAFT ACUTE MI REVASCULARIZATION N/A 10/28/2022   Procedure: Coronary/Graft Acute MI Revascularization;  Surgeon: Marykay Lex, MD;  Location: [facility] INVASIVE CV LAB;  Service: Cardiovascular;  Laterality: N/A;   LEFT HEART CATH AND CORONARY ANGIOGRAPHY N/A 10/28/2022   Procedure: LEFT HEART CATH AND CORONARY ANGIOGRAPHY;  Surgeon: Marykay Lex, MD;  Location: [facility] INVASIVE CV LAB;  Service: Cardiovascular;  Laterality: N/A;   VASECTOMY  09/2015    REVIEW OF SYSTEMS:   Review of Systems  Constitutional: Negative for appetite change, chills, fatigue, fever  and unexpected weight change.  HENT:   Negative for mouth sores, nosebleeds, sore throat and trouble swallowing.   Eyes: Negative for eye problems and icterus.  Respiratory: Negative for cough, hemoptysis, shortness of breath and wheezing.   Cardiovascular: Negative for chest pain and leg swelling.  Gastrointestinal: Negative for abdominal pain, constipation, diarrhea, nausea and vomiting.  Genitourinary: Negative for bladder incontinence, difficulty urinating, dysuria, frequency and hematuria.    Musculoskeletal: Negative for back pain, gait problem, neck pain and neck stiffness.  Skin: Negative for itching and rash.  Neurological: Negative for dizziness, extremity weakness, gait problem, headaches, light-headedness and seizures.  Hematological: Negative for adenopathy. Does not bruise/bleed easily.  Psychiatric/Behavioral: Negative for confusion, depression and sleep disturbance. The patient is not nervous/anxious.     PHYSICAL EXAMINATION:  There were no vitals taken for this visit.  ECOG PERFORMANCE STATUS: {CHL ONC ECOG [id number]  Physical Exam  Constitutional: Oriented to person, place, and time and well-developed, well-nourished, and in no distress. No distress.  HENT:  Head: Normocephalic and atraumatic.  Mouth/Throat: Oropharynx is clear and moist. No oropharyngeal exudate.  Eyes: Conjunctivae are normal. Right eye exhibits no discharge. Left eye exhibits no discharge. No scleral icterus.  Neck: Normal range of motion. Neck supple.  Cardiovascular: Normal rate, regular rhythm, normal heart sounds and intact distal pulses.   Pulmonary/Chest: Effort normal and breath sounds normal. No respiratory distress. No wheezes. No rales.  Abdominal: Soft. Bowel sounds are normal. Exhibits no distension and no mass. There is no tenderness.  Musculoskeletal: Normal range of motion. Exhibits no edema.  Lymphadenopathy:    No cervical adenopathy.  Neurological: Alert and oriented to person, place, and time. Exhibits normal muscle tone. Gait normal. Coordination normal.  Skin: Skin is warm and dry. No rash noted. Not diaphoretic. No erythema. No pallor.  Psychiatric: Mood, memory and judgment normal.  Vitals reviewed.  LABORATORY DATA: Lab Results  Component Value Date   WBC 2.6 (L) 03/12/2023   HGB 14.1 03/12/2023   HCT 40.6 03/12/2023   MCV 98.1 03/12/2023   PLT 81 (L) 03/12/2023      Chemistry      Component Value Date/Time   NA 142 02/20/2023 0855   NA 141  01/28/2023 0734   NA 142 05/05/2017 0914   K 4.1 02/20/2023 0855   K 4.0 05/05/2017 0914   CL 106 02/20/2023 0855   CO2 30 02/20/2023 0855   CO2 28 05/05/2017 0914   BUN 14 02/20/2023 0855   BUN 14 01/28/2023 0734   BUN 13.6 05/05/2017 0914   CREATININE 1.02 02/20/2023 0855   CREATININE 1.0 05/05/2017 0914      Component Value Date/Time   CALCIUM 9.5 02/20/2023 0855   CALCIUM 9.2 05/05/2017 0914   ALKPHOS 60 02/20/2023 0855   ALKPHOS 67 05/05/2017 0914   AST 31 02/20/2023 0855   AST 107 (H) 05/05/2017 0914   ALT 43 02/20/2023 0855   ALT 241 (H) 05/05/2017 0914   BILITOT 0.6 02/20/2023 0855   BILITOT 0.93 05/05/2017 0914       RADIOGRAPHIC STUDIES:  CT BONE MARROW BIOPSY & ASPIRATION  Result Date: 03/12/2023 INDICATION: Bicytopenia EXAM: CT GUIDED BONE MARROW ASPIRATES AND BIOPSY MEDICATIONS: None. ANESTHESIA/SEDATION: Versed 2 mg IV Fentanyl 100 mcg IV Moderate Sedation Time:  10 minutes The patient was continuously monitored during the procedure by the interventional radiology nurse under my direct supervision. TECHNIQUE: Multidetector CT imaging of the pelvis was performed following the standard protocol without IV contrast.  RADIATION DOSE REDUCTION: This exam was performed according to the departmental dose-optimization program which includes automated exposure control, adjustment of the mA and/or kV according to patient size and/or use of iterative reconstruction technique. COMPLICATIONS: None immediate. PROCEDURE: The procedure was explained to the patient. The risks and benefits of the procedure were discussed and the patient's questions were addressed. Informed consent was obtained from the patient. The patient was placed prone on CT table. Images of the pelvis were obtained. The right side of back was prepped and draped in sterile fashion. The skin and right posterior ilium were anesthetized with 1% lidocaine. 11 gauge bone needle was directed into the right ilium with CT  guidance. Two aspirates and 1 core biopsy were obtained. Bandage placed over the puncture site. IMPRESSION: CT guided bone marrow aspiration and core biopsy. Electronically Signed   By: Acquanetta Belling M.D.   On: 03/12/2023 11:06     ASSESSMENT/PLAN:  This is a very pleasant 52 year old Caucasian male with leukocytopenia and thrombocytopenia.  It is felt that his thrombocytopenia is likely ITP.  His leukopenia could be autoimmune in origin versus drug-induced.  The patient recently had a bone marrow biopsy and aspirate performed.  The patient was seen with Dr. Arbutus Ped today.  Dr. Arbutus Ped personally and independently reviewed the results of his bone marrow biopsy and aspirate and reviewed the results with the patient today.  The results showed***  Dr. Arbutus Ped recommends that he***observation with repeat blood work and***months.  Rheumatology?  The patient was advised to call immediately if he has any concerning symptoms in the interval. The patient voices understanding of current disease status and treatment options and is in agreement with the current care plan. All questions were answered. The patient knows to call the clinic with any problems, questions or concerns. We can certainly see the patient much sooner if necessary       No orders of the defined types were placed in this encounter.    I spent {CHL ONC TIME VISIT - QMVHQ:[id number]} counseling the patient face to face. The total time spent in the appointment was {CHL ONC TIME VISIT - XLKGM:[id number]}.  Tracina Beaumont L Collyn Selk, PA-C 03/23/23

## 2023-03-23 NOTE — Telephone Encounter (Signed)
Scheduled per 10/04 scheduling message, patient has been called and voicemail was left.

## 2023-03-24 ENCOUNTER — Telehealth: Payer: Self-pay | Admitting: Medical Oncology

## 2023-03-24 NOTE — Telephone Encounter (Signed)
Pt said to cancel appt on Thursday due to he is in school. He requested an appt 10/17 . Schedule message sent.

## 2023-03-25 ENCOUNTER — Inpatient Hospital Stay: Payer: BC Managed Care – PPO | Admitting: Physician Assistant

## 2023-03-26 ENCOUNTER — Encounter (HOSPITAL_COMMUNITY): Payer: Self-pay

## 2023-03-31 NOTE — Progress Notes (Signed)
Cardiology Office Note:    Date:  04/04/2023   ID:  Craig Macias, DOB 1971/05/30, MRN [id number]  PCP:  Farris Has, MD  Cardiologist:  Little Ishikawa, MD  Electrophysiologist:  None   Referring MD: Farris Has, MD   Chief Complaint  Patient presents with   Follow-up    3 months.   Coronary Artery Disease    History of Present Illness:    Craig Macias is a 52 y.o. male with a hx of CAD status post STEMI 10/2022 with PCI to RPDA, fatty liver disease, chronic thrombocytopenia who presents for follow-up.  Patient developed sudden onset of chest pain on evening of 10/25/2022, reported 10 out of 10 chest pain that lasted for 4 hours.  He did not seek medical attention at that time.  He presented to his PCP's office 5/14 for annual physical and EKG showed ST elevations so he was transported to the ED via EMS. EKG in ED showed normal sinus rhythm with Q waves and mild ST elevation in inferior leads and leads V3-V6.  This was reviewed with interventional cardiology and suspected late presentation of STEMI, so STEMI alert was not activated.  High-sensitivity troponin downtrending 11, 563 >> 11,386.   Echo showed EF 50 to 55%, normal RV function, no significant valvular disease.  Plan was for cath on 5/15, but patient developed acute chest pain around 6 AM on 5/15 and worsening ST elevations on EKG.  STEMI alert activated, found to have occluded RPDA 3, underwent successful PCI with DES.  Also noted to have 35% proximal mid RCA stenosis, 40% RPDA 1, 50% RPDA 2, 50% ramus, 40% D1.  EF appeared 45 to 50% on cath.  Repeat echo 10/29/22 showed EF 40-45%.  Echocardiogram 01/28/2023 showed EF 55 to 60%, minimal apical hypokinesis, normal RV function, mild mitral regurgitation.  Zio patch x 7 days 01/2023 showed no significant arrhythmias.  Since last clinic visit, he reports he is doing well.  Denies any bleeding on DAPT.  Reports he works out regularly, will do push-ups and bench press, along with  doing cardio 4-5 times per week with 25 to 30 minutes of jogging/fast walking.  Denies any exertional symptoms.  Reports some lightheadedness with bending over but otherwise denies any lightheadedness/syncope.  No lower extremity edema.  Reports palpitations have improved.  Past Medical History:  Diagnosis Date   Coronary artery disease    Hyperlipidemia    Hypertension     Past Surgical History:  Procedure Laterality Date   CARDIAC CATHETERIZATION     CORONARY/GRAFT ACUTE MI REVASCULARIZATION N/A 10/28/2022   Procedure: Coronary/Graft Acute MI Revascularization;  Surgeon: Marykay Lex, MD;  Location: [facility] INVASIVE CV LAB;  Service: Cardiovascular;  Laterality: N/A;   LEFT HEART CATH AND CORONARY ANGIOGRAPHY N/A 10/28/2022   Procedure: LEFT HEART CATH AND CORONARY ANGIOGRAPHY;  Surgeon: Marykay Lex, MD;  Location: [facility] INVASIVE CV LAB;  Service: Cardiovascular;  Laterality: N/A;   VASECTOMY  09/2015    Current Medications: Current Meds  Medication Sig   ASCORBIC ACID PO Take 1 tablet by mouth daily. Vitamin C, unknown strength.   aspirin EC 81 MG tablet Take 1 tablet (81 mg total) by mouth daily. Swallow whole.   Evolocumab (REPATHA SURECLICK) 140 MG/ML SOAJ Inject 140 mg into the skin every 14 (fourteen) days.   fexofenadine (ALLEGRA) 180 MG tablet Take 180 mg by mouth daily.   losartan (COZAAR) 25 MG tablet Take 1 tablet (25 mg total)  by mouth daily.   MAGNESIUM OXIDE PO Take 1 tablet by mouth at bedtime.   metoprolol succinate (TOPROL-XL) 25 MG 24 hr tablet Take 1 tablet (25 mg total) by mouth daily.   Multiple Vitamin (MULTIVITAMIN ADULT PO) 1 tablet Orally once a day   nitroGLYCERIN (NITROSTAT) 0.4 MG SL tablet Place 1 tablet (0.4 mg total) under the tongue every 5 (five) minutes x 3 doses as needed for chest pain.   ticagrelor (BRILINTA) 90 MG TABS tablet Take 1 tablet (90 mg total) by mouth 2 (two) times daily.     Allergies:   Atorvastatin, Amoxil [amoxicillin], Biaxin  [clarithromycin], and Penicillins   Social History   Socioeconomic History   Marital status: Married    Spouse name: Not on file   Number of children: Not on file   Years of education: Not on file   Highest education level: Bachelor's degree (e.g., BA, AB, BS)  Occupational History   Not on file  Tobacco Use   Smoking status: Former    Types: Cigarettes   Smokeless tobacco: Former   Tobacco comments:    Financial trader   Vaping status: Never Used  Substance and Sexual Activity   Alcohol use: Not Currently    Alcohol/week: 1.0 standard drink of alcohol    Types: 1 Cans of beer per week    Comment: occasional   Drug use: Not Currently    Types: Marijuana    Comment: occasional   Sexual activity: Yes    Birth control/protection: None  Other Topics Concern   Not on file  Social History Narrative   Not on file   Social Determinants of Health   Financial Resource Strain: Low Risk  (10/28/2022)   Overall Financial Resource Strain (CARDIA)    Difficulty of Paying Living Expenses: Not hard at all  Food Insecurity: No Food Insecurity (10/28/2022)   Hunger Vital Sign    Worried About Running Out of Food in the Last Year: Never true    Ran Out of Food in the Last Year: Never true  Transportation Needs: No Transportation Needs (10/28/2022)   PRAPARE - Administrator, Civil Service (Medical): No    Lack of Transportation (Non-Medical): No  Physical Activity: Not on file  Stress: Not on file  Social Connections: Not on file     Family History: The patient's family history includes Cancer in his father; Colitis in his mother; Heart attack in his paternal grandfather; Stroke in his maternal grandfather.  ROS:   Please see the history of present illness.     All other systems reviewed and are negative.  EKGs/Labs/Other Studies Reviewed:    The following studies were reviewed today:   EKG:   11/06/2022: Normal sinus rhythm, rate 68, persistent ST elevations in  inferior leads and V3-6; appears unchanged from post PCI EKG on 10/28/2022  Recent Labs: 02/20/2023: ALT 43; BUN 14; Creatinine 1.02; Potassium 4.1; Sodium 142; TSH 1.480 03/12/2023: Hemoglobin 14.1; Platelets 81  Recent Lipid Panel    Component Value Date/Time   CHOL 97 (L) 01/28/2023 0734   TRIG 90 01/28/2023 0734   HDL 44 01/28/2023 0734   CHOLHDL 2.2 01/28/2023 0734   CHOLHDL 2.6 10/28/2022 0940   VLDL 10 10/28/2022 0940   LDLCALC 35 01/28/2023 0734    Physical Exam:    VS:  BP 96/64 (BP Location: Left Arm, Patient Position: Sitting, Cuff Size: Normal)   Pulse 62   Ht 6\' 1"  (1.854  m)   Wt 203 lb (92.1 kg)   BMI 26.78 kg/m     Wt Readings from Last 3 Encounters:  04/01/23 203 lb (92.1 kg)  03/12/23 206 lb (93.4 kg)  02/20/23 206 lb 4.8 oz (93.6 kg)     GEN:  Well nourished, well developed in no acute distress HEENT: Normal NECK: No JVD; No carotid bruits LYMPHATICS: No lymphadenopathy CARDIAC: RRR, no murmurs, rubs, gallops RESPIRATORY:  Clear to auscultation without rales, wheezing or rhonchi  ABDOMEN: Soft, non-tender, non-distended MUSCULOSKELETAL:  No edema; No deformity  SKIN: Warm and dry NEUROLOGIC:  Alert and oriented x 3 PSYCHIATRIC:  Normal affect   ASSESSMENT:    1. Coronary artery disease involving native coronary artery of native heart without angina pectoris   2. Heart failure with recovered ejection fraction (HFrecEF) (HCC)   3. Pre-op evaluation   4. Palpitations      PLAN:    CAD: Patient developed sudden onset of chest pain on evening of 10/25/2022, reported 10 out of 10 chest pain that lasted for 4 hours.  He presented to his PCP's office 5/14 for annual physical and EKG showed ST elevations so he was transported to the ED via EMS. EKG here shows normal sinus rhythm with Q waves and mild ST elevation in inferior leads and leads V3-V6.  This was reviewed with interventional cardiology and suspected late presentation of STEMI, so STEMI alert was  not activated.  High-sensitivity troponin downtrending 11, 563 >> 11,386.   Echo shows EF 50 to 55%, normal RV function, no significant valvular disease.  Plan was for cath on 5/15, but patient developed acute chest pain around 6 AM on 5/15 and worsening ST elevations on EKG.  STEMI alert activated, found to have occluded RPDA 3, underwent successful PCI with DES.  Also noted to have 35% proximal mid RCA stenosis, 40% RPDA 1, 50% RPDA 2, 50% ramus, 40% D1.  EF appeared 45 to 50% on cath.  Repeat echo 10/29/22 showed EF 40-45% - Continue ASA 81 mg daily, ticagrelor 90 mg twice daily - Started atorvastatin 80 mg daily.  Discontinued statin due to worsening LFTs.  Referred to pharmacy lipid clinic and started Repatha.  LDL 35 on 01/28/2023 - Continue toprol XL 25 mg daily   Heart failure with recovered ejection fraction: due to MI.  Echo 10/29/22 showed EF 40-45%.  Echocardiogram 01/28/2023 showed EF 55 to 60%, minimal apical hypokinesis, normal RV function, mild mitral regurgitation -Continue toprol XL 25 mg daily -Continue losartan 25 mg daily  Preop evaluation: planning sperm extraction procedure next month for IVF.  At that point, he will be over 6 months from his MI, okay to hold ticagrelor x 5 days for procedure and then restart once okay per urologist after procedure.  Should continue aspirin perioperatively.  Palpitations:Zio patch x 7 days 01/2023 showed no significant arrhythmias.  Reports palpitations have resolved.   Chronic Thrombocytopenia: history of chronic thrombocytopenia with baseline platelets in the 90s to low 100s.  Has been stable.  Follows with hematology, likely ITP.   Transaminitis: mild, has history of fatty liver disease.  Worsened with starting statin, switched to Repatha as above.  Normal LFTs on most recent labs 02/20/2023  RTC in 6 months    Medication Adjustments/Labs and Tests Ordered: Current medicines are reviewed at length with the patient today.  Concerns regarding  medicines are outlined above.  No orders of the defined types were placed in this encounter.  No orders of  the defined types were placed in this encounter.   Patient Instructions  Medication Instructions:  Continue all current medications Hold Brilinta 60 mg 5 days before your procedure and then restart as soon as urologist says it's ok Continue to take your Aspirin *If you need a refill on your cardiac medications before your next appointment, please call your pharmacy*   Lab Work: none   Testing/Procedures: none   Follow-Up: At [facility], you and your health needs are our priority.  As part of our continuing mission to provide you with exceptional heart care, we have created designated Provider Care Teams.  These Care Teams include your primary Cardiologist (physician) and Advanced Practice Providers (APPs -  Physician Assistants and Nurse Practitioners) who all work together to provide you with the care you need, when you need it.  Your next appointment:   6 month(s). Call office in Jan 20205 t schedule appt. For April 2025  Provider:   Little Ishikawa, MD        Signed, Little Ishikawa, MD  04/04/2023 10:19 PM    [facility]

## 2023-04-01 ENCOUNTER — Ambulatory Visit: Payer: BC Managed Care – PPO | Attending: Cardiology | Admitting: Cardiology

## 2023-04-01 ENCOUNTER — Encounter: Payer: Self-pay | Admitting: Cardiology

## 2023-04-01 ENCOUNTER — Ambulatory Visit: Payer: BC Managed Care – PPO | Admitting: Cardiology

## 2023-04-01 VITALS — BP 96/64 | HR 62 | Ht 73.0 in | Wt 203.0 lb

## 2023-04-01 DIAGNOSIS — I5032 Chronic diastolic (congestive) heart failure: Secondary | ICD-10-CM

## 2023-04-01 DIAGNOSIS — Z01818 Encounter for other preprocedural examination: Secondary | ICD-10-CM

## 2023-04-01 DIAGNOSIS — I2510 Atherosclerotic heart disease of native coronary artery without angina pectoris: Secondary | ICD-10-CM | POA: Diagnosis not present

## 2023-04-01 DIAGNOSIS — R002 Palpitations: Secondary | ICD-10-CM | POA: Diagnosis not present

## 2023-04-01 NOTE — Patient Instructions (Addendum)
Medication Instructions:  Continue all current medications Hold Brilinta 60 mg 5 days before your procedure and then restart as soon as urologist says it's ok Continue to take your Aspirin *If you need a refill on your cardiac medications before your next appointment, please call your pharmacy*   Lab Work: none   Testing/Procedures: none   Follow-Up: At [facility], you and your health needs are our priority.  As part of our continuing mission to provide you with exceptional heart care, we have created designated Provider Care Teams.  These Care Teams include your primary Cardiologist (physician) and Advanced Practice Providers (APPs -  Physician Assistants and Nurse Practitioners) who all work together to provide you with the care you need, when you need it.  Your next appointment:   6 month(s). Call office in Jan 20205 t schedule appt. For April 2025  Provider:   Little Ishikawa, MD

## 2023-04-24 ENCOUNTER — Other Ambulatory Visit: Payer: Self-pay | Admitting: Cardiology

## 2023-04-24 DIAGNOSIS — I2111 ST elevation (STEMI) myocardial infarction involving right coronary artery: Secondary | ICD-10-CM

## 2023-04-24 DIAGNOSIS — E785 Hyperlipidemia, unspecified: Secondary | ICD-10-CM

## 2023-04-26 ENCOUNTER — Encounter: Payer: Self-pay | Admitting: Cardiology

## 2023-05-24 ENCOUNTER — Other Ambulatory Visit: Payer: BC Managed Care – PPO

## 2023-05-24 ENCOUNTER — Ambulatory Visit: Payer: BC Managed Care – PPO | Admitting: Internal Medicine

## 2023-05-25 ENCOUNTER — Ambulatory Visit: Payer: BC Managed Care – PPO | Admitting: Internal Medicine

## 2023-05-25 ENCOUNTER — Other Ambulatory Visit: Payer: BC Managed Care – PPO

## 2023-06-01 ENCOUNTER — Other Ambulatory Visit: Payer: Self-pay | Admitting: Medical Oncology

## 2023-06-01 ENCOUNTER — Other Ambulatory Visit: Payer: Self-pay | Admitting: Cardiology

## 2023-06-01 ENCOUNTER — Inpatient Hospital Stay: Payer: BC Managed Care – PPO | Attending: Internal Medicine

## 2023-06-01 ENCOUNTER — Inpatient Hospital Stay (HOSPITAL_BASED_OUTPATIENT_CLINIC_OR_DEPARTMENT_OTHER): Payer: BC Managed Care – PPO | Admitting: Internal Medicine

## 2023-06-01 VITALS — BP 105/68 | HR 56 | Temp 98.2°F | Resp 17 | Ht 73.0 in | Wt 201.4 lb

## 2023-06-01 DIAGNOSIS — D72819 Decreased white blood cell count, unspecified: Secondary | ICD-10-CM | POA: Insufficient documentation

## 2023-06-01 DIAGNOSIS — D696 Thrombocytopenia, unspecified: Secondary | ICD-10-CM | POA: Insufficient documentation

## 2023-06-01 DIAGNOSIS — I2111 ST elevation (STEMI) myocardial infarction involving right coronary artery: Secondary | ICD-10-CM

## 2023-06-01 DIAGNOSIS — D708 Other neutropenia: Secondary | ICD-10-CM

## 2023-06-01 LAB — CBC WITH DIFFERENTIAL (CANCER CENTER ONLY)
Abs Immature Granulocytes: 0 10*3/uL (ref 0.00–0.07)
Basophils Absolute: 0 10*3/uL (ref 0.0–0.1)
Basophils Relative: 0 %
Eosinophils Absolute: 0.1 10*3/uL (ref 0.0–0.5)
Eosinophils Relative: 2 %
HCT: 40 % (ref 39.0–52.0)
Hemoglobin: 14.2 g/dL (ref 13.0–17.0)
Immature Granulocytes: 0 %
Lymphocytes Relative: 19 %
Lymphs Abs: 0.6 10*3/uL — ABNORMAL LOW (ref 0.7–4.0)
MCH: 34.3 pg — ABNORMAL HIGH (ref 26.0–34.0)
MCHC: 35.5 g/dL (ref 30.0–36.0)
MCV: 96.6 fL (ref 80.0–100.0)
Monocytes Absolute: 0.3 10*3/uL (ref 0.1–1.0)
Monocytes Relative: 11 %
Neutro Abs: 1.9 10*3/uL (ref 1.7–7.7)
Neutrophils Relative %: 68 %
Platelet Count: 99 10*3/uL — ABNORMAL LOW (ref 150–400)
RBC: 4.14 MIL/uL — ABNORMAL LOW (ref 4.22–5.81)
RDW: 12.4 % (ref 11.5–15.5)
WBC Count: 2.9 10*3/uL — ABNORMAL LOW (ref 4.0–10.5)
nRBC: 0 % (ref 0.0–0.2)

## 2023-06-01 LAB — CMP (CANCER CENTER ONLY)
ALT: 34 U/L (ref 0–44)
AST: 26 U/L (ref 15–41)
Albumin: 4.8 g/dL (ref 3.5–5.0)
Alkaline Phosphatase: 57 U/L (ref 38–126)
Anion gap: 6 (ref 5–15)
BUN: 17 mg/dL (ref 6–20)
CO2: 31 mmol/L (ref 22–32)
Calcium: 9.8 mg/dL (ref 8.9–10.3)
Chloride: 105 mmol/L (ref 98–111)
Creatinine: 0.99 mg/dL (ref 0.61–1.24)
GFR, Estimated: >60 mL/min (ref >60)
Glucose, Bld: 80 mg/dL (ref 70–99)
Potassium: 4.5 mmol/L (ref 3.5–5.1)
Sodium: 142 mmol/L (ref 135–145)
Total Bilirubin: 0.8 mg/dL (ref <1.2)
Total Protein: 7.1 g/dL (ref 6.5–8.1)

## 2023-06-01 LAB — LACTATE DEHYDROGENASE: LDH: 138 U/L (ref 98–192)

## 2023-06-01 NOTE — Progress Notes (Signed)
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 His leukocytopenia could be also autoimmune in origin but drug-induced or underlying bone marrow abnormality could not be completely excluded at this point.   PRIOR THERAPY: None  CURRENT THERAPY: Observation  INTERVAL HISTORY: Craig Macias 52 y.o. male returns to the clinic today for follow-up visit.Discussed the use of AI scribe software for clinical note transcription with the patient, who gave verbal consent to proceed.  History of Present Illness   The patient, a 52 year old with a history of persistent low platelet and white blood cell counts, underwent a bone marrow biopsy and aspirate in late September. The procedure was well-tolerated, with the patient expressing more concern about the anticipation of the results than the procedure itself.  Despite the low counts, the patient reports feeling well with no complaints. He denies any bleeding issues, bruising, nose bleeds, gum bleeds, or recurrent infections such as upper respiratory or urinary tract infections. 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 The low counts have not affected his daily activities or overall health.  The patient's physical examination was unremarkable, with no concerning findings. The patient's most recent lab results were pending at the time of the consultation.       MEDICAL HISTORY: Past Medical History:  Diagnosis Date   Coronary artery disease    Hyperlipidemia    Hypertension     ALLERGIES:  is allergic to atorvastatin, amoxil [amoxicillin], biaxin [clarithromycin], and penicillins.  MEDICATIONS:  Current Outpatient Medications  Medication Sig Dispense Refill    ASCORBIC ACID PO Take 1 tablet by mouth daily. Vitamin C, unknown strength.     aspirin EC 81 MG tablet Take 1 tablet (81 mg total) by mouth daily. Swallow whole. 90 tablet 1   Evolocumab (REPATHA SURECLICK) 140 MG/ML SOAJ INJECT 140 MG INTO THE SKIN EVERY 14 (FOURTEEN) DAYS. 6 mL 1   fexofenadine (ALLEGRA) 180 MG tablet Take 180 mg by mouth daily.     losartan (COZAAR) 25 MG tablet Take 1 tablet (25 mg total) by mouth daily. 90 tablet 1   MAGNESIUM OXIDE PO Take 1 tablet by mouth at bedtime.     metoprolol succinate (TOPROL-XL) 25 MG 24 hr tablet Take 1 tablet (25 mg total) by mouth daily. 90 tablet 1   Multiple Vitamin (MULTIVITAMIN ADULT PO) 1 tablet Orally once a day     nitroGLYCERIN (NITROSTAT) 0.4 MG SL tablet Place 1 tablet (0.4 mg total) under the tongue every 5 (five) minutes x 3 doses as needed for chest pain. 25 tablet 1   ticagrelor (BRILINTA) 90 MG TABS tablet Take 1 tablet (90 mg total) by mouth 2 (two) times daily. 180 tablet 1   No current facility-administered medications for this visit.    SURGICAL HISTORY:  Past Surgical History:  Procedure Laterality Date   CARDIAC CATHETERIZATION     CORONARY/GRAFT ACUTE MI REVASCULARIZATION N/A 10/28/2022   Procedure: Coronary/Graft Acute MI Revascularization;  Surgeon: Marykay Lex, MD;  Location: [facility] INVASIVE CV LAB;  Service: Cardiovascular;  Laterality: N/A;   LEFT HEART CATH AND CORONARY ANGIOGRAPHY N/A 10/28/2022   Procedure: LEFT HEART CATH AND CORONARY ANGIOGRAPHY;  Surgeon: Marykay Lex, MD;  Location: [facility] INVASIVE CV LAB;  Service: Cardiovascular;  Laterality: N/A;   VASECTOMY  09/2015    REVIEW OF SYSTEMS:  A comprehensive review of systems was negative.   PHYSICAL EXAMINATION: General appearance: alert, cooperative, and no distress Head: Normocephalic, without obvious abnormality, atraumatic Neck: no adenopathy, no JVD, supple, symmetrical, trachea midline, and thyroid not enlarged, symmetric, no  tenderness/mass/nodules Lymph nodes: Cervical, supraclavicular, and axillary nodes normal. Resp: clear to auscultation bilaterally Back: symmetric, no curvature. ROM normal. No CVA tenderness. Cardio: regular rate and rhythm, S1, S2 normal, no murmur, click, rub or gallop GI: soft, non-tender; bowel sounds normal; no masses,  no organomegaly Extremities: extremities normal, atraumatic, no cyanosis or edema  ECOG PERFORMANCE STATUS: 0 - Asymptomatic  Blood pressure 105/68, pulse (!) 56, temperature 98.2 F (36.8 C), temperature source Temporal, resp. rate 17, height 6\' 1"  (1.854 m), weight 201 lb 6.4 oz (91.4 kg), SpO2 100%.  LABORATORY DATA: Lab Results  Component Value Date   WBC 2.6 (L) 03/12/2023   HGB 14.1 03/12/2023   HCT 40.6 03/12/2023   MCV 98.1 03/12/2023   PLT 81 (L) 03/12/2023      Chemistry      Component Value Date/Time   NA 142 02/20/2023 0855   NA 141 01/28/2023 0734   NA 142 05/05/2017 0914   K 4.1 02/20/2023 0855   K 4.0 05/05/2017 0914   CL 106 02/20/2023 0855   CO2 30 02/20/2023 0855   CO2 28 05/05/2017 0914   BUN 14 02/20/2023 0855   BUN 14 01/28/2023 0734   BUN 13.6 05/05/2017 0914   CREATININE 1.02 02/20/2023 0855   CREATININE 1.0 05/05/2017 0914      Component Value Date/Time   CALCIUM 9.5 02/20/2023 0855   CALCIUM 9.2 05/05/2017 0914   ALKPHOS 60 02/20/2023 0855   ALKPHOS 67 05/05/2017 0914   AST 31 02/20/2023 0855   AST 107 (H) 05/05/2017 0914   ALT 43 02/20/2023 0855   ALT 241 (H) 05/05/2017 0914   BILITOT 0.6 02/20/2023 0855   BILITOT 0.93 05/05/2017 0914       RADIOGRAPHIC STUDIES: No results found.  ASSESSMENT AND PLAN: This is a very pleasant 52 years old white male with chronic leukocytopenia and thrombocytopenia that has been going on for several years and likely secondary to autoimmune disorder. The patient had a bone marrow biopsy and aspirate performed recently that showed no concerning underlying abnormalities.     Chronic Thrombocytopenia and Leukopenia Chronic low platelet and white blood cell counts. Bone marrow biopsy and aspirate in late September showed no evidence of leukemia, myelodysplastic syndrome, or myeloproliferative disorders. Asymptomatic with no bleeding, bruising, recurrent infections, or other related symptoms. Possible autoimmune etiology considered but not confirmed. Remains active with no impact on daily activities. If CBC results remain stable or improve, no further intervention is needed. Condition can be monitored by family doctor with hematology/oncology follow-up as needed. - Monitor CBC results - Follow up with family doctor for routine visits - Return to hematology/oncology if symptoms worsen or if referred by family doctor.   He was advised to call immediately if he has any other concerning symptoms in the interval.  The patient voices understanding of current disease status and treatment options and is in agreement with the current care plan.  All questions were answered. The patient knows to call the clinic with any problems, questions or concerns. We can certainly see the patient much sooner if necessary.  The total time spent in the appointment was 20 minutes.  Disclaimer: This note was dictated with voice recognition software. Similar sounding words can inadvertently be transcribed and may not be corrected upon review.

## 2023-07-21 ENCOUNTER — Other Ambulatory Visit: Payer: Self-pay | Admitting: Cardiology

## 2023-07-21 DIAGNOSIS — I2111 ST elevation (STEMI) myocardial infarction involving right coronary artery: Secondary | ICD-10-CM

## 2023-08-15 ENCOUNTER — Other Ambulatory Visit: Payer: Self-pay | Admitting: Cardiology

## 2023-08-15 DIAGNOSIS — I2111 ST elevation (STEMI) myocardial infarction involving right coronary artery: Secondary | ICD-10-CM

## 2023-10-11 ENCOUNTER — Other Ambulatory Visit (HOSPITAL_COMMUNITY): Payer: Self-pay

## 2023-10-11 ENCOUNTER — Telehealth: Payer: Self-pay | Admitting: Pharmacy Technician

## 2023-10-11 NOTE — Telephone Encounter (Signed)
 Pharmacy Patient Advocate Encounter   Received notification from Fax that prior authorization for Repatha  is required/requested.   Insurance verification completed.   The patient is insured through [facility] .   Per test claim: PA required; PA submitted to above mentioned insurance via Fax Key/confirmation #/EOC [id number] Fax Status is pending

## 2023-10-12 ENCOUNTER — Other Ambulatory Visit: Payer: Self-pay | Admitting: Cardiology

## 2023-10-12 DIAGNOSIS — I2111 ST elevation (STEMI) myocardial infarction involving right coronary artery: Secondary | ICD-10-CM

## 2023-10-12 DIAGNOSIS — E785 Hyperlipidemia, unspecified: Secondary | ICD-10-CM

## 2023-10-18 ENCOUNTER — Other Ambulatory Visit (HOSPITAL_COMMUNITY): Payer: Self-pay

## 2023-10-21 ENCOUNTER — Other Ambulatory Visit (HOSPITAL_COMMUNITY): Payer: Self-pay

## 2023-10-22 ENCOUNTER — Other Ambulatory Visit (HOSPITAL_COMMUNITY): Payer: Self-pay

## 2023-10-25 ENCOUNTER — Ambulatory Visit: Payer: Self-pay | Admitting: Cardiology

## 2023-10-27 ENCOUNTER — Other Ambulatory Visit (HOSPITAL_COMMUNITY): Payer: Self-pay

## 2023-11-01 ENCOUNTER — Other Ambulatory Visit (HOSPITAL_COMMUNITY): Payer: Self-pay

## 2023-11-03 ENCOUNTER — Other Ambulatory Visit (HOSPITAL_COMMUNITY): Payer: Self-pay

## 2023-11-03 NOTE — Telephone Encounter (Signed)
 Pharmacy Patient Advocate Encounter  Received notification from [employer] [facility] that Prior Authorization for Repatha  has been APPROVED from 11/03/23 to 11/02/24   PA #/Case ID/Reference #: [id number]

## 2023-12-07 LAB — LAB REPORT - SCANNED: EGFR: 89

## 2023-12-08 ENCOUNTER — Encounter: Payer: Self-pay | Admitting: Family Medicine

## 2023-12-13 ENCOUNTER — Ambulatory Visit: Payer: Self-pay | Attending: Cardiology | Admitting: Cardiology

## 2023-12-13 VITALS — BP 104/70 | HR 55 | Ht 73.0 in | Wt 212.8 lb

## 2023-12-13 DIAGNOSIS — I2510 Atherosclerotic heart disease of native coronary artery without angina pectoris: Secondary | ICD-10-CM | POA: Diagnosis not present

## 2023-12-13 DIAGNOSIS — I5032 Chronic diastolic (congestive) heart failure: Secondary | ICD-10-CM | POA: Diagnosis not present

## 2023-12-13 DIAGNOSIS — E785 Hyperlipidemia, unspecified: Secondary | ICD-10-CM

## 2023-12-13 NOTE — Progress Notes (Signed)
 Cardiology Office Note:   Date:  12/13/2023  ID:  Craig Macias, DOB 1971-06-14, MRN [id number] PCP: Kip Righter, MD  [facility] Providers Cardiologist:  Lonni LITTIE Nanas, MD    History of Present Illness:   Discussed the use of AI scribe software for clinical note transcription with the patient, who gave verbal consent to proceed.  History of Present Illness Craig Macias is a 53 year old male with coronary artery disease, chronic thrombocytopenia, fatty liver disease, HFrecEF who presents for a routine follow-up.  He has a history of coronary artery disease, having experienced a STEMI in May 2024, which was treated with PCI to the RPDA. His echocardiogram initially showed an LVEF of 50-55%, which decreased to 40-45% post-catheterization. Recovered to normal as of repeat TTE in August 2024. No new cardiac symptoms have been noted since his last visit, and he is regularly engaging in physical activity, including walking, jogging, and weightlifting.  He reports a recent sinus infection but denies any new chest pain or significant shortness of breath. He experiences occasional shortness of breath, which he attributes to being out of shape, and occasional dizziness when standing quickly.  He is currently on Brilinta  90 mg twice daily. He also takes Repatha  for cholesterol management, which he tolerates well, and his recent LDL was noted to be 21. 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   Today patient denies chest pain, shortness of breath, lower extremity edema, fatigue, palpitations, melena, hematuria, hemoptysis, diaphoresis, weakness, presyncope, syncope, orthopnea, and PND.   Studies Reviewed:    EKG:   EKG Interpretation Date/Time:  Monday December 13 2023 09:09:21 EDT Ventricular Rate:  55 PR Interval:  180 QRS Duration:  80 QT Interval:  414 QTC Calculation: 396 R Axis:   65  Text  Interpretation: Sinus bradycardia Possible Inferior infarct (cited on or before 28-Oct-2022) When compared with ECG of 29-Oct-2022 06:38, Vent. rate has decreased BY  40 BPM ST less elevated in Inferior leads Nonspecific T wave abnormality now evident in Inferior leads Confirmed by Trudy Birmingham [phone]) on 12/13/2023 9:17:38 AM    01/28/23 TTE  IMPRESSIONS     1. Apical window is foreshortened some in some views Minimal hypokinesis  of apex . Left ventricular ejection fraction, by estimation, is 55 to 60%.  The left ventricle has normal function. Left ventricular diastolic  parameters are indeterminate. The  average left ventricular global longitudinal strain is -19.1 %. The global  longitudinal strain is normal.   2. Right ventricular systolic function is normal. The right ventricular  size is normal. There is normal pulmonary artery systolic pressure.   3. The mitral valve is normal in structure. Mild mitral valve  regurgitation.   4. The aortic valve is normal in structure. Aortic valve regurgitation is  not visualized.   5. The inferior vena cava is normal in size with greater than 50%  respiratory variability, suggesting right atrial pressure of 3 mmHg.   FINDINGS   Left Ventricle: Apical window is foreshortened some in some views Minimal  hypokinesis of apex. Left ventricular ejection fraction, by estimation, is  55 to 60%. The left ventricle has normal function. The average left  ventricular global longitudinal  strain is -19.1 %. The global longitudinal strain is normal. The left  ventricular internal cavity size was normal in size. There is no left  ventricular hypertrophy. Left ventricular diastolic parameters are  indeterminate.   Right  Ventricle: The right ventricular size is normal. Right vetricular  wall thickness was not assessed. Right ventricular systolic function is  normal. There is normal pulmonary artery systolic pressure. The tricuspid  regurgitant velocity is  1.31 m/s,  and with an assumed right atrial pressure of 3 mmHg, the estimated right  ventricular systolic pressure is 9.9 mmHg.   Left Atrium: Left atrial size was normal in size.   Right Atrium: Right atrial size was normal in size.   Pericardium: There is no evidence of pericardial effusion.   Mitral Valve: The mitral valve is normal in structure. Mild mitral valve  regurgitation.   Tricuspid Valve: The tricuspid valve is normal in structure. Tricuspid  valve regurgitation is mild.   Aortic Valve: The aortic valve is normal in structure. Aortic valve  regurgitation is not visualized.   Pulmonic Valve: The pulmonic valve was grossly normal. Pulmonic valve  regurgitation is not visualized.   Aorta: The aortic root and ascending aorta are structurally normal, with  no evidence of dilitation.   Venous: The inferior vena cava is normal in size with greater than 50%  respiratory variability, suggesting right atrial pressure of 3 mmHg.   IAS/Shunts: No atrial level shunt detected by color flow Doppler.   Risk Assessment/Calculations:              Physical Exam:   VS:  BP 104/70 (BP Location: Right Arm)   Pulse (!) 55   Ht 6' 1 (1.854 m)   Wt 212 lb 12.8 oz (96.5 kg)   SpO2 97%   BMI 28.08 kg/m    Wt Readings from Last 3 Encounters:  12/13/23 212 lb 12.8 oz (96.5 kg)  06/01/23 201 lb 6.4 oz (91.4 kg)  04/01/23 203 lb (92.1 kg)     Physical Exam Vitals reviewed.  Constitutional:      Appearance: Normal appearance.  HENT:     Head: Normocephalic.   Eyes:     Pupils: Pupils are equal, round, and reactive to light.    Cardiovascular:     Rate and Rhythm: Normal rate and regular rhythm.     Pulses: Normal pulses.     Heart sounds: Normal heart sounds.  Pulmonary:     Effort: Pulmonary effort is normal.     Breath sounds: Normal breath sounds.  Abdominal:     General: Abdomen is flat.     Palpations: Abdomen is soft.   Musculoskeletal:     Right lower leg:  No edema.     Left lower leg: No edema.   Skin:    General: Skin is warm and dry.     Capillary Refill: Capillary refill takes less than 2 seconds.   Neurological:     General: No focal deficit present.     Mental Status: He is alert and oriented to person, place, and time.   Psychiatric:        Mood and Affect: Mood normal.        Behavior: Behavior normal.        Thought Content: Thought content normal.        Judgment: Judgment normal.     ASSESSMENT AND PLAN:    Assessment & Plan Coronary artery disease with history of STEMI STEMI in May 2024 with PCI to RPDA. Currently asymptomatic with no chest pain or dyspnea. EKG shows improvement from prior with resolution of inferior lead ST elevation, no acute ischemic changes. LDL cholesterol well-controlled with Repatha . Discussion on de-escalation of dual  antiplatelet therapy given >1 year from STEMI. Awaiting input from primary cardiologist regarding transition  - Continue current dose of Brilinta /ASA until further notice - Await Dr. Alvan recommendation on transitioning to Plavix vs maintenance Brilinta  therapy. - Continue Repatha  for cholesterol management - Encourage regular physical activity without restrictions - Simple dental extractions (i.e. 1-2 teeth) are considered low risk procedures per guidelines and generally do not require any specific cardiac clearance. It is also generally accepted that for simple extractions and dental cleanings, there is no need to interrupt blood thinner therapy.  HFrecEF Echo 10/29/22 showed EF 40-45%.  Echocardiogram 01/28/2023 showed EF 55 to 60%, minimal apical hypokinesis, normal RV function, mild mitral regurgitation. Euvolemic today with no symptoms of congestive heart failure. - Continue Losartan  25mg  - Continue Toprol  XL 25mg . Consider dose reduction if intermittent dizziness becomes a more significant issue.  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  - Follows with hematology  Goals of Care Discussion on dental procedures and antiplatelet therapy. No need to alter blood thinner therapy for routine dental cleanings. Emphasis on the importance of maintaining current medications for cardiovascular health. - Document statement regarding dental procedures and blood thinner therapy in medical record   Follow up in 1 year.          Signed, Artist Pouch, PA-C

## 2023-12-13 NOTE — Patient Instructions (Signed)
 Medication Instructions:  No medication changes were made during today's visit.  *If you need a refill on your cardiac medications before your next appointment, please call your pharmacy*   Lab Work: No labs were ordered during today's visit.  If you have labs (blood work) drawn today and your tests are completely normal, you will receive your results only by: MyChart Message (if you have MyChart) OR A paper copy in the mail If you have any lab test that is abnormal or we need to change your treatment, we will call you to review the results.   Testing/Procedures: No medication changes were made during today's visit.    Follow-Up: At [facility], you and your health needs are our priority.  As part of our continuing mission to provide you with exceptional heart care, we have created designated Provider Care Teams.  These Care Teams include your primary Cardiologist (physician) and Advanced Practice Providers (APPs -  Physician Assistants and Nurse Practitioners) who all work together to provide you with the care you need, when you need it.  We recommend signing up for the patient portal called MyChart.  Sign up information is provided on this After Visit Summary.  MyChart is used to connect with patients for Virtual Visits (Telemedicine).  Patients are able to view lab/test results, encounter notes, upcoming appointments, etc.  Non-urgent messages can be sent to your provider as well.   To learn more about what you can do with MyChart, go to [email or web address].    Your next appointment:   1 year(s)  Provider:   Lonni LITTIE Nanas, MD    Other Instructions Thank you for choosing [facility]! A letter will be mailed to you as a reminder to call the office for your next follow up appointment.

## 2023-12-15 ENCOUNTER — Telehealth: Payer: Self-pay | Admitting: *Deleted

## 2023-12-15 MED ORDER — CLOPIDOGREL BISULFATE 75 MG PO TABS: 75.0000 mg | ORAL_TABLET | Freq: Every day | ORAL | 3 refills | Status: AC

## 2023-12-15 NOTE — Telephone Encounter (Signed)
 Returned a call back to the pt.  Pt aware that per Artist Pouch PA-C he can stop his ASA and Brilinta , and will start him on Plavix 75 mg po daily to take instead.   Confirmed the pharmacy of choice with the pt.  Pt verbalized understanding and agrees with this plan.

## 2023-12-15 NOTE — Addendum Note (Signed)
 Addended by: GLADIS PORTER HERO on: 12/15/2023 11:56 AM   Modules accepted: Orders

## 2023-12-15 NOTE — Telephone Encounter (Signed)
 Left the pt a message to call the office back to endorse recommendations per Dr. Kate and Artist Pouch PA-C.

## 2023-12-15 NOTE — Telephone Encounter (Signed)
 Pt returning call, requesting cb

## 2023-12-15 NOTE — Telephone Encounter (Signed)
-----   Message from Artist Pouch sent at 12/15/2023 11:00 AM EDT ----- Regarding: FW: Anti-platelet monotherapy With patient now greater than one year after his MI, can transition to antiplatelet monotherapy. Please have patient discontinue ASA and Brilinta . He in place start Plavix 75mg  daily.  Artist Pouch, PA-C ----- Message ----- From: Kate Lonni CROME, MD Sent: 12/13/2023  10:40 PM EDT To: Artist Pouch, PA-C Subject: RE: Anti-platelet monotherapy                  Walterine Artist,  I would favor plavix 75 mg daily as less bleeding risk given his ITP history. Thanks, [place] ----- Message ----- From: Pouch Artist RIGGERS Sent: 12/13/2023   9:43 AM EDT To: Lonni CROME Kate, MD Subject: Anti-platelet monotherapy                      Good morning!   Just saw this gentleman for routine follow up. He's now >1 year post STEMI and I'm hoping to transition to P2Y12 monotherapy. Do you have a preference of Brilinta  60mg  BID vs Plavix 75mg ? Hx ITP.  Artist Pouch, PA-C

## 2024-01-02 ENCOUNTER — Other Ambulatory Visit: Payer: Self-pay | Admitting: Cardiology

## 2024-01-02 DIAGNOSIS — E785 Hyperlipidemia, unspecified: Secondary | ICD-10-CM

## 2024-01-02 DIAGNOSIS — I2111 ST elevation (STEMI) myocardial infarction involving right coronary artery: Secondary | ICD-10-CM

## 2024-02-11 ENCOUNTER — Other Ambulatory Visit: Payer: Self-pay | Admitting: Cardiology

## 2024-02-11 DIAGNOSIS — I2111 ST elevation (STEMI) myocardial infarction involving right coronary artery: Secondary | ICD-10-CM
